# Patient Record
Sex: Female | Born: 1980 | Race: White | Hispanic: Yes | Marital: Single | Smoking: Never smoker
Health system: Southern US, Community
[De-identification: ages and names within clinical notes are randomized; demographics above are authoritative.]

## PROBLEM LIST (undated history)

## (undated) DIAGNOSIS — Z603 Acculturation difficulty: Secondary | ICD-10-CM

## (undated) DIAGNOSIS — Z789 Other specified health status: Secondary | ICD-10-CM

## (undated) HISTORY — PX: NO PAST SURGERIES: SHX2092

---

## 2005-07-13 ENCOUNTER — Inpatient Hospital Stay (HOSPITAL_COMMUNITY): Admission: AD | Admit: 2005-07-13 | Discharge: 2005-07-15 | Payer: Self-pay | Admitting: Obstetrics

## 2008-09-10 ENCOUNTER — Inpatient Hospital Stay (HOSPITAL_COMMUNITY): Admission: AD | Admit: 2008-09-10 | Discharge: 2008-09-12 | Payer: Self-pay | Admitting: Obstetrics

## 2011-08-30 LAB — CBC
HCT: 35.8 — ABNORMAL LOW
Hemoglobin: 12
Hemoglobin: 14.7
RBC: 3.86 — ABNORMAL LOW
RBC: 4.69
WBC: 12.7 — ABNORMAL HIGH
WBC: 12.9 — ABNORMAL HIGH

## 2011-08-30 LAB — RPR: RPR Ser Ql: NONREACTIVE

## 2011-10-13 ENCOUNTER — Other Ambulatory Visit (HOSPITAL_COMMUNITY): Payer: Self-pay | Admitting: Physician Assistant

## 2011-10-13 DIAGNOSIS — Z3689 Encounter for other specified antenatal screening: Secondary | ICD-10-CM

## 2011-10-13 LAB — RUBELLA ANTIBODY, IGM: Rubella: IMMUNE

## 2011-10-13 LAB — HIV ANTIBODY (ROUTINE TESTING W REFLEX): HIV: NONREACTIVE

## 2011-10-13 LAB — ABO/RH: RH Type: POSITIVE

## 2011-10-13 LAB — HEPATITIS B SURFACE ANTIGEN: Hepatitis B Surface Ag: NEGATIVE

## 2011-10-13 LAB — OB RESULTS CONSOLE VARICELLA ZOSTER ANTIBODY, IGG: Varicella: IMMUNE

## 2011-10-18 ENCOUNTER — Ambulatory Visit (HOSPITAL_COMMUNITY)
Admission: RE | Admit: 2011-10-18 | Discharge: 2011-10-18 | Disposition: A | Discharge status: Home / Self Care | Payer: Medicaid Other | Source: Ambulatory Visit | Attending: Physician Assistant | Admitting: Physician Assistant

## 2011-10-18 DIAGNOSIS — O358XX0 Maternal care for other (suspected) fetal abnormality and damage, not applicable or unspecified: Secondary | ICD-10-CM | POA: Insufficient documentation

## 2011-10-18 DIAGNOSIS — Z1389 Encounter for screening for other disorder: Secondary | ICD-10-CM | POA: Insufficient documentation

## 2011-10-18 DIAGNOSIS — Z3689 Encounter for other specified antenatal screening: Secondary | ICD-10-CM

## 2011-10-18 DIAGNOSIS — Z363 Encounter for antenatal screening for malformations: Secondary | ICD-10-CM | POA: Insufficient documentation

## 2011-11-29 ENCOUNTER — Other Ambulatory Visit (HOSPITAL_COMMUNITY): Payer: Self-pay | Admitting: Family

## 2011-11-29 LAB — RPR: RPR: NONREACTIVE

## 2011-12-01 ENCOUNTER — Ambulatory Visit (HOSPITAL_COMMUNITY)
Admission: RE | Admit: 2011-12-01 | Discharge: 2011-12-01 | Disposition: A | Discharge status: Home / Self Care | Payer: Self-pay | Source: Ambulatory Visit | Attending: Family | Admitting: Family

## 2011-12-01 DIAGNOSIS — O99891 Other specified diseases and conditions complicating pregnancy: Secondary | ICD-10-CM | POA: Insufficient documentation

## 2011-12-01 DIAGNOSIS — R319 Hematuria, unspecified: Secondary | ICD-10-CM | POA: Insufficient documentation

## 2012-01-27 ENCOUNTER — Encounter (HOSPITAL_COMMUNITY): Payer: Self-pay | Admitting: Pharmacist

## 2012-01-27 ENCOUNTER — Encounter (HOSPITAL_COMMUNITY): Payer: Self-pay

## 2012-01-28 ENCOUNTER — Encounter (HOSPITAL_COMMUNITY): Payer: Self-pay

## 2012-01-28 ENCOUNTER — Encounter (HOSPITAL_COMMUNITY)
Admission: RE | Admit: 2012-01-28 | Discharge: 2012-01-28 | Disposition: A | Discharge status: Home / Self Care | Payer: Medicaid Other | Source: Ambulatory Visit | Attending: Family Medicine | Admitting: Family Medicine

## 2012-01-28 HISTORY — DX: Other specified health status: Z78.9

## 2012-01-28 HISTORY — DX: Acculturation difficulty: Z60.3

## 2012-01-28 LAB — CBC
HCT: 38.5 % (ref 36.0–46.0)
MCH: 30.5 pg (ref 26.0–34.0)
MCV: 88.9 fL (ref 78.0–100.0)
RDW: 12 % (ref 11.5–15.5)
WBC: 9.8 10*3/uL (ref 4.0–10.5)

## 2012-01-28 NOTE — Patient Instructions (Addendum)
   Your procedure is scheduled on: Tuesday March 5th  Enter through the Main Entrance of [facility] at:1PM Pick up the phone at the desk and dial [phone] and inform us of your arrival.  Please call this number if you have any problems the morning of surgery: [phone]  Remember: Do not eat food after midnight:Monday Do not drink clear liquids after: 10:30am Tuesday Take these medicines the morning of surgery with a SIP OF WATER:  Do not wear jewelry, make-up, or FINGER nail polish Do not wear lotions, powders, perfumes or deodorant. Do not shave 48 hours prior to surgery. Do not bring valuables to the hospital. Contacts, dentures or bridgework may not be worn into surgery.  Leave suitcase in the car. After Surgery it may be brought to your room. For patients being admitted to the hospital, checkout time is 11:00am the day of discharge.     Remember to use your hibiclens as instructed.Please shower with 1/2 bottle the evening before your surgery and the other 1/2 bottle the morning of surgery. Neck down avoiding private area.

## 2012-02-01 ENCOUNTER — Encounter (HOSPITAL_COMMUNITY): Payer: Self-pay | Admitting: *Deleted

## 2012-02-01 ENCOUNTER — Inpatient Hospital Stay (HOSPITAL_COMMUNITY)
Admission: RE | Admit: 2012-02-01 | Discharge: 2012-02-04 | DRG: 766 | Disposition: A | Discharge status: Home Health | Payer: Medicaid Other | Source: Ambulatory Visit | Attending: Family Medicine | Admitting: Family Medicine

## 2012-02-01 ENCOUNTER — Inpatient Hospital Stay (HOSPITAL_COMMUNITY): Payer: Medicaid Other

## 2012-02-01 ENCOUNTER — Encounter (HOSPITAL_COMMUNITY)
Admission: RE | Disposition: A | Discharge status: Home Health | Payer: Self-pay | Source: Ambulatory Visit | Attending: Family Medicine

## 2012-02-01 ENCOUNTER — Encounter (HOSPITAL_COMMUNITY): Payer: Self-pay

## 2012-02-01 DIAGNOSIS — O321XX0 Maternal care for breech presentation, not applicable or unspecified: Secondary | ICD-10-CM

## 2012-02-01 DIAGNOSIS — Z01812 Encounter for preprocedural laboratory examination: Secondary | ICD-10-CM

## 2012-02-01 DIAGNOSIS — Z98891 History of uterine scar from previous surgery: Secondary | ICD-10-CM

## 2012-02-01 LAB — TYPE AND SCREEN: ABO/RH(D): O POS

## 2012-02-01 LAB — ABO/RH: ABO/RH(D): O POS

## 2012-02-01 SURGERY — Surgical Case
Anesthesia: Spinal

## 2012-02-01 MED ORDER — CEFAZOLIN SODIUM 1-5 GM-% IV SOLN
INTRAVENOUS | Status: AC
  Filled 2012-02-01: qty 50

## 2012-02-01 MED ORDER — LANOLIN HYDROUS EX OINT: 1.0000 "application " | TOPICAL_OINTMENT | CUTANEOUS | Status: DC | PRN

## 2012-02-01 MED ORDER — KETOROLAC TROMETHAMINE 30 MG/ML IJ SOLN
30.0000 mg | Freq: Four times a day (QID) | INTRAMUSCULAR | Status: AC | PRN
  Administered 2012-02-01: 30 mg via INTRAMUSCULAR

## 2012-02-01 MED ORDER — PHENYLEPHRINE 40 MCG/ML (10ML) SYRINGE FOR IV PUSH (FOR BLOOD PRESSURE SUPPORT)
PREFILLED_SYRINGE | INTRAVENOUS | Status: AC
  Filled 2012-02-01: qty 15

## 2012-02-01 MED ORDER — NALBUPHINE HCL 10 MG/ML IJ SOLN
5.0000 mg | INTRAMUSCULAR | Status: DC | PRN
  Filled 2012-02-01: qty 1

## 2012-02-01 MED ORDER — LACTATED RINGERS IV SOLN
INTRAVENOUS | Status: DC
  Administered 2012-02-01: 13:00:00 via INTRAVENOUS

## 2012-02-01 MED ORDER — FENTANYL CITRATE 0.05 MG/ML IJ SOLN: 25.0000 ug | INTRAMUSCULAR | Status: DC | PRN

## 2012-02-01 MED ORDER — METOCLOPRAMIDE HCL 5 MG/ML IJ SOLN: 10.0000 mg | Freq: Three times a day (TID) | INTRAMUSCULAR | Status: DC | PRN

## 2012-02-01 MED ORDER — ONDANSETRON HCL 4 MG/2ML IJ SOLN
INTRAMUSCULAR | Status: AC
  Filled 2012-02-01: qty 2

## 2012-02-01 MED ORDER — SIMETHICONE 80 MG PO CHEW: 80.0000 mg | CHEWABLE_TABLET | ORAL | Status: DC | PRN

## 2012-02-01 MED ORDER — SCOPOLAMINE 1 MG/3DAYS TD PT72
1.0000 | MEDICATED_PATCH | Freq: Once | TRANSDERMAL | Status: DC
  Administered 2012-02-01: 1.5 mg via TRANSDERMAL

## 2012-02-01 MED ORDER — OXYTOCIN 10 UNIT/ML IJ SOLN
INTRAMUSCULAR | Status: AC
  Filled 2012-02-01: qty 4

## 2012-02-01 MED ORDER — MORPHINE SULFATE 0.5 MG/ML IJ SOLN
INTRAMUSCULAR | Status: AC
  Filled 2012-02-01: qty 10

## 2012-02-01 MED ORDER — ONDANSETRON HCL 4 MG/2ML IJ SOLN: 4.0000 mg | Freq: Three times a day (TID) | INTRAMUSCULAR | Status: DC | PRN

## 2012-02-01 MED ORDER — IBUPROFEN 600 MG PO TABS
600.0000 mg | ORAL_TABLET | Freq: Four times a day (QID) | ORAL | Status: DC
  Administered 2012-02-01 – 2012-02-04 (×10): 600 mg via ORAL
  Filled 2012-02-01 (×2): qty 1

## 2012-02-01 MED ORDER — IBUPROFEN 600 MG PO TABS
600.0000 mg | ORAL_TABLET | Freq: Four times a day (QID) | ORAL | Status: DC | PRN
  Filled 2012-02-01 (×9): qty 1

## 2012-02-01 MED ORDER — SCOPOLAMINE 1 MG/3DAYS TD PT72
MEDICATED_PATCH | TRANSDERMAL | Status: AC
  Administered 2012-02-01: 1.5 mg via TRANSDERMAL
  Filled 2012-02-01: qty 1

## 2012-02-01 MED ORDER — SENNOSIDES-DOCUSATE SODIUM 8.6-50 MG PO TABS
2.0000 | ORAL_TABLET | Freq: Every day | ORAL | Status: DC
  Administered 2012-02-01 – 2012-02-03 (×3): 2 via ORAL

## 2012-02-01 MED ORDER — SODIUM CHLORIDE 0.9 % IJ SOLN: 3.0000 mL | INTRAMUSCULAR | Status: DC | PRN

## 2012-02-01 MED ORDER — MENTHOL 3 MG MT LOZG: 1.0000 | LOZENGE | OROMUCOSAL | Status: DC | PRN

## 2012-02-01 MED ORDER — FENTANYL CITRATE 0.05 MG/ML IJ SOLN
INTRAMUSCULAR | Status: AC
  Filled 2012-02-01: qty 2

## 2012-02-01 MED ORDER — LACTATED RINGERS IV SOLN
INTRAVENOUS | Status: DC
  Administered 2012-02-01 (×3): via INTRAVENOUS

## 2012-02-01 MED ORDER — PHENYLEPHRINE 40 MCG/ML (10ML) SYRINGE FOR IV PUSH (FOR BLOOD PRESSURE SUPPORT)
PREFILLED_SYRINGE | INTRAVENOUS | Status: AC
  Filled 2012-02-01: qty 10

## 2012-02-01 MED ORDER — BUPIVACAINE IN DEXTROSE 0.75-8.25 % IT SOLN
INTRATHECAL | Status: DC | PRN
  Administered 2012-02-01: 1.2 mL via INTRATHECAL

## 2012-02-01 MED ORDER — DIPHENHYDRAMINE HCL 50 MG/ML IJ SOLN: 25.0000 mg | INTRAMUSCULAR | Status: DC | PRN

## 2012-02-01 MED ORDER — KETOROLAC TROMETHAMINE 30 MG/ML IJ SOLN
INTRAMUSCULAR | Status: AC
  Filled 2012-02-01: qty 1

## 2012-02-01 MED ORDER — SCOPOLAMINE 1 MG/3DAYS TD PT72
1.0000 | MEDICATED_PATCH | Freq: Once | TRANSDERMAL | Status: DC
  Filled 2012-02-01: qty 1

## 2012-02-01 MED ORDER — KETOROLAC TROMETHAMINE 30 MG/ML IJ SOLN: 30.0000 mg | Freq: Four times a day (QID) | INTRAMUSCULAR | Status: AC | PRN

## 2012-02-01 MED ORDER — ONDANSETRON HCL 4 MG/2ML IJ SOLN: 4.0000 mg | INTRAMUSCULAR | Status: DC | PRN

## 2012-02-01 MED ORDER — CEFAZOLIN SODIUM 1-5 GM-% IV SOLN
1.0000 g | INTRAVENOUS | Status: AC
  Administered 2012-02-01: 1 g via INTRAVENOUS

## 2012-02-01 MED ORDER — LACTATED RINGERS IV SOLN
INTRAVENOUS | Status: DC
  Administered 2012-02-02: 01:00:00 via INTRAVENOUS

## 2012-02-01 MED ORDER — SIMETHICONE 80 MG PO CHEW
80.0000 mg | CHEWABLE_TABLET | Freq: Three times a day (TID) | ORAL | Status: DC
  Administered 2012-02-01 – 2012-02-04 (×10): 80 mg via ORAL

## 2012-02-01 MED ORDER — PHENYLEPHRINE HCL 10 MG/ML IJ SOLN
INTRAMUSCULAR | Status: DC | PRN
  Administered 2012-02-01: 40 ug via INTRAVENOUS
  Administered 2012-02-01: 120 ug via INTRAVENOUS
  Administered 2012-02-01: 40 ug via INTRAVENOUS
  Administered 2012-02-01: 80 ug via INTRAVENOUS

## 2012-02-01 MED ORDER — MEPERIDINE HCL 25 MG/ML IJ SOLN: 6.2500 mg | INTRAMUSCULAR | Status: DC | PRN

## 2012-02-01 MED ORDER — ONDANSETRON HCL 4 MG/2ML IJ SOLN
INTRAMUSCULAR | Status: DC | PRN
  Administered 2012-02-01: 4 mg via INTRAVENOUS

## 2012-02-01 MED ORDER — ACETAMINOPHEN 325 MG PO TABS: 325.0000 mg | ORAL_TABLET | ORAL | Status: DC | PRN

## 2012-02-01 MED ORDER — NALOXONE HCL 0.4 MG/ML IJ SOLN: 0.4000 mg | INTRAMUSCULAR | Status: DC | PRN

## 2012-02-01 MED ORDER — EPHEDRINE 5 MG/ML INJ
INTRAVENOUS | Status: AC
  Filled 2012-02-01: qty 10

## 2012-02-01 MED ORDER — SODIUM CHLORIDE 0.9 % IV SOLN
1.0000 ug/kg/h | INTRAVENOUS | Status: DC | PRN
  Filled 2012-02-01: qty 2.5

## 2012-02-01 MED ORDER — ACETAMINOPHEN 10 MG/ML IV SOLN
1000.0000 mg | Freq: Four times a day (QID) | INTRAVENOUS | Status: AC | PRN
  Filled 2012-02-01: qty 100

## 2012-02-01 MED ORDER — DIBUCAINE 1 % RE OINT: 1.0000 "application " | TOPICAL_OINTMENT | RECTAL | Status: DC | PRN

## 2012-02-01 MED ORDER — DIPHENHYDRAMINE HCL 50 MG/ML IJ SOLN: 12.5000 mg | INTRAMUSCULAR | Status: DC | PRN

## 2012-02-01 MED ORDER — ONDANSETRON HCL 4 MG PO TABS: 4.0000 mg | ORAL_TABLET | ORAL | Status: DC | PRN

## 2012-02-01 MED ORDER — TETANUS-DIPHTH-ACELL PERTUSSIS 5-2.5-18.5 LF-MCG/0.5 IM SUSP
0.5000 mL | Freq: Once | INTRAMUSCULAR | Status: AC
  Administered 2012-02-02: 0.5 mL via INTRAMUSCULAR
  Filled 2012-02-01: qty 0.5

## 2012-02-01 MED ORDER — CEFAZOLIN SODIUM 1-5 GM-% IV SOLN
INTRAVENOUS | Status: DC | PRN
  Administered 2012-02-01: 1 g via INTRAVENOUS

## 2012-02-01 MED ORDER — EPHEDRINE SULFATE 50 MG/ML IJ SOLN
INTRAMUSCULAR | Status: DC | PRN
  Administered 2012-02-01: 25 mg via INTRAVENOUS
  Administered 2012-02-01 (×2): 10 mg via INTRAVENOUS

## 2012-02-01 MED ORDER — OXYCODONE-ACETAMINOPHEN 5-325 MG PO TABS
1.0000 | ORAL_TABLET | ORAL | Status: DC | PRN
  Administered 2012-02-03 (×2): 1 via ORAL
  Filled 2012-02-01 (×2): qty 1

## 2012-02-01 MED ORDER — MORPHINE SULFATE (PF) 0.5 MG/ML IJ SOLN
INTRAMUSCULAR | Status: DC | PRN
  Administered 2012-02-01: .1 mg via INTRATHECAL

## 2012-02-01 MED ORDER — WITCH HAZEL-GLYCERIN EX PADS: 1.0000 "application " | MEDICATED_PAD | CUTANEOUS | Status: DC | PRN

## 2012-02-01 MED ORDER — DIPHENHYDRAMINE HCL 25 MG PO CAPS: 25.0000 mg | ORAL_CAPSULE | ORAL | Status: DC | PRN

## 2012-02-01 MED ORDER — ZOLPIDEM TARTRATE 5 MG PO TABS: 5.0000 mg | ORAL_TABLET | Freq: Every evening | ORAL | Status: DC | PRN

## 2012-02-01 MED ORDER — OXYTOCIN 20 UNITS IN LACTATED RINGERS INFUSION - SIMPLE
INTRAVENOUS | Status: AC
  Filled 2012-02-01: qty 1000

## 2012-02-01 MED ORDER — PRENATAL MULTIVITAMIN CH
1.0000 | ORAL_TABLET | Freq: Every day | ORAL | Status: DC
  Administered 2012-02-02 – 2012-02-04 (×3): 1 via ORAL
  Filled 2012-02-01 (×3): qty 1

## 2012-02-01 MED ORDER — OXYTOCIN 20 UNITS IN LACTATED RINGERS INFUSION - SIMPLE
125.0000 mL/h | INTRAVENOUS | Status: AC
  Administered 2012-02-01: 125 mL/h via INTRAVENOUS

## 2012-02-01 MED ORDER — DIPHENHYDRAMINE HCL 50 MG/ML IJ SOLN
INTRAMUSCULAR | Status: AC
  Filled 2012-02-01: qty 1

## 2012-02-01 MED ORDER — FENTANYL CITRATE 0.05 MG/ML IJ SOLN
INTRAMUSCULAR | Status: DC | PRN
  Administered 2012-02-01: 25 ug via INTRATHECAL

## 2012-02-01 MED ORDER — DIPHENHYDRAMINE HCL 25 MG PO CAPS: 25.0000 mg | ORAL_CAPSULE | Freq: Four times a day (QID) | ORAL | Status: DC | PRN

## 2012-02-01 MED ORDER — OXYTOCIN 10 UNIT/ML IJ SOLN
INTRAMUSCULAR | Status: DC | PRN
  Administered 2012-02-01: 20 [IU] via INTRAMUSCULAR

## 2012-02-01 SURGICAL SUPPLY — 35 items
APL SKNCLS STERI-STRIP NONHPOA (GAUZE/BANDAGES/DRESSINGS) ×1
BARRIER ADHS 3X4 INTERCEED (GAUZE/BANDAGES/DRESSINGS) IMPLANT
BENZOIN TINCTURE PRP APPL 2/3 (GAUZE/BANDAGES/DRESSINGS) ×1 IMPLANT
BRR ADH 4X3 ABS CNTRL BYND (GAUZE/BANDAGES/DRESSINGS)
CHLORAPREP W/TINT 26ML (MISCELLANEOUS) ×2 IMPLANT
CLOTH BEACON ORANGE TIMEOUT ST (SAFETY) ×2 IMPLANT
DRESSING TELFA 8X3 (GAUZE/BANDAGES/DRESSINGS) ×1 IMPLANT
DRSG PAD ABDOMINAL 8X10 ST (GAUZE/BANDAGES/DRESSINGS) ×1 IMPLANT
ELECT REM PT RETURN 9FT ADLT (ELECTROSURGICAL) ×2
ELECTRODE REM PT RTRN 9FT ADLT (ELECTROSURGICAL) ×1 IMPLANT
EXTRACTOR VACUUM KIWI (MISCELLANEOUS) IMPLANT
GLOVE BIO SURGEON STRL SZ 6.5 (GLOVE) ×2 IMPLANT
GLOVE BIOGEL PI IND STRL 7.0 (GLOVE) ×2 IMPLANT
GLOVE BIOGEL PI INDICATOR 7.0 (GLOVE) ×2
GOWN PREVENTION PLUS LG XLONG (DISPOSABLE) ×6 IMPLANT
KIT ABG SYR 3ML LUER SLIP (SYRINGE) IMPLANT
NDL HYPO 25X5/8 SAFETYGLIDE (NEEDLE) IMPLANT
NEEDLE HYPO 25X5/8 SAFETYGLIDE (NEEDLE) IMPLANT
NS IRRIG 1000ML POUR BTL (IV SOLUTION) ×2 IMPLANT
PACK C SECTION WH (CUSTOM PROCEDURE TRAY) ×2 IMPLANT
RETRACTOR WND ALEXIS 25 LRG (MISCELLANEOUS) IMPLANT
RTRCTR WOUND ALEXIS 25CM LRG (MISCELLANEOUS) ×2
SLEEVE SCD COMPRESS KNEE LRG (MISCELLANEOUS) IMPLANT
SLEEVE SCD COMPRESS KNEE MED (MISCELLANEOUS) IMPLANT
SPONGE GAUZE 4X4 12PLY (GAUZE/BANDAGES/DRESSINGS) ×1 IMPLANT
STRIP CLOSURE SKIN 1/2X4 (GAUZE/BANDAGES/DRESSINGS) ×1 IMPLANT
SUT VIC AB 0 CT1 36 (SUTURE) ×12 IMPLANT
SUT VIC AB 2-0 CT1 27 (SUTURE) ×2
SUT VIC AB 2-0 CT1 TAPERPNT 27 (SUTURE) ×1 IMPLANT
SUT VIC AB 4-0 KS 27 (SUTURE) ×1 IMPLANT
SUT VIC AB 4-0 PS2 27 (SUTURE) ×2 IMPLANT
TAPE CLOTH SURG 4X10 WHT LF (GAUZE/BANDAGES/DRESSINGS) ×1 IMPLANT
TOWEL OR 17X24 6PK STRL BLUE (TOWEL DISPOSABLE) ×4 IMPLANT
TRAY FOLEY CATH 14FR (SET/KITS/TRAYS/PACK) IMPLANT
WATER STERILE IRR 1000ML POUR (IV SOLUTION) ×2 IMPLANT

## 2012-02-01 NOTE — Anesthesia Postprocedure Evaluation (Signed)
Anesthesia Post Note  Patient: Dawn [name]  Procedure(s) Performed: Procedure(s) (LRB): CESAREAN SECTION (N/A)  Anesthesia type: Spinal  Patient location: PACU  Post pain: Pain level controlled  Post assessment: Post-op Vital signs reviewed  Last Vitals:  Filed Vitals:   02/01/12 1539  BP: 118/66  Pulse: 111  Temp: 36.6 C  Resp: 24    Post vital signs: Reviewed  Level of consciousness: awake  Complications: No apparent anesthesia complications

## 2012-02-01 NOTE — Transfer of Care (Signed)
Immediate Anesthesia Transfer of Care Note  Patient: Dawn [name]  Procedure(s) Performed: Procedure(s) (LRB): CESAREAN SECTION (N/A)  Patient Location: PACU  Anesthesia Type: Spinal  Level of Consciousness: awake, alert  and oriented  Airway & Oxygen Therapy: Patient Spontanous Breathing  Post-op Assessment: Report given to PACU RN and Post -op Vital signs reviewed and stable  Post vital signs: Reviewed and stable  Complications: No apparent anesthesia complications

## 2012-02-01 NOTE — Op Note (Addendum)
Dawn [name] PROCEDURE DATE: 02/01/2012  PREOPERATIVE DIAGNOSIS: Intrauterine pregnancy at  [redacted]w[redacted]d weeks gestation; breech presentation  POSTOPERATIVE DIAGNOSIS: The same  PROCEDURE: Primary Low Transverse Cesarean Section  SURGEON:  Dr. Scheryl Darter  ASSISTANT: Dr Candelaria Celeste  INDICATIONS: Dawn [name] is a 31 y.o. [id number] at [redacted]w[redacted]d scheduled for cesarean section secondary to breech presentation.  The risks of cesarean section discussed with the patient included but were not limited to: bleeding which may require transfusion or reoperation; infection which may require antibiotics; injury to bowel, bladder, ureters or other surrounding organs; injury to the fetus; need for additional procedures including hysterectomy in the event of a life-threatening hemorrhage; placental abnormalities wth subsequent pregnancies, incisional problems, thromboembolic phenomenon and other postoperative/anesthesia complications. The patient concurred with the proposed plan, giving informed written consent for the procedure.    FINDINGS:  Viable female infant in cephalic presentation.  Apgars 8 and 9, weight, 7 pounds and 6 ounces.  Clear amniotic fluid.  Intact placenta, three vessel cord.  Normal uterus, fallopian tubes and ovaries bilaterally.  ANESTHESIA:    Spinal INTRAVENOUS FLUIDS:2000 ml ESTIMATED BLOOD LOSS: 500 ml URINE OUTPUT:  150 ml SPECIMENS: Placenta sent to L&D COMPLICATIONS: None immediate  PROCEDURE IN DETAIL:  The patient received intravenous antibiotics and had sequential compression devices applied to her lower extremities while in the preoperative area.  She was then taken to the operating room where spinal anesthesia was administered and was found to be adequate. She was then placed in a dorsal supine position with a leftward tilt, and prepped and draped in a sterile manner.  A foley catheter was placed into her bladder and attached to constant gravity, which drained clear fluid  throughout.  After an adequate timeout was performed, a Pfannenstiel skin incision was made with scalpel and carried through to the underlying layer of fascia. The fascia was incised in the midline and this incision was extended bilaterally using the Mayo scissors. Kocher clamps were applied to the superior aspect of the fascial incision and the underlying rectus muscles were dissected off bluntly. A similar process was carried out on the inferior aspect of the facial incision. The rectus muscles were separated in the midline bluntly and the peritoneum was entered bluntly.  An Alexis retractor was placed.   Attention was turned to the lower uterine segment where a transverse hysterotomy was made with a scalpel and extended bilaterally bluntly.  The infant was successfully delivered, and cord was clamped and cut and infant was handed over to awaiting neonatology team. Uterine massage was then administered and the placenta delivered intact with three-vessel cord. The uterus was then cleared of clot and debris.  The hysterotomy was closed with 0 Vicryl in a running locked fashion, and an imbricating layer was also placed with a 0 Vicryl. Overall, excellent hemostasis was noted. The abdomen and the pelvis were cleared of all clot and debris. Hemostasis was confirmed on all surfaces.  The peritoneum was reapproximated using 2-0 vicryl running stitches. The fascia was then closed using 0 Vicryl in a running fashion.  The skin was closed with 4-0 Vicryl. The patient tolerated the procedure well. Sponge, lap, instrument and needle counts were correct x 2. She was taken to the recovery room in stable condition.    Candelaria Celeste JEHIEL DO 02/01/2012 3:30 PM

## 2012-02-01 NOTE — Anesthesia Preprocedure Evaluation (Signed)

## 2012-02-01 NOTE — Consult Note (Signed)
Called to attend primary C/S  At term secondary to breech presentation.  No other risk factors reported. Translator present and physician introduced.    AT birth infant in breech with a single loose nuchal cord per the OB; good tone observed but cry was not until cord clamped and infant en route to radiant warmer.   Normal transiiton with slow recovery of central color. No dysmorphic features.  Given Apgar scores of 8 and 9 at one and five minutes respectively.  Voided in OR.  Infant shown to parents with translator present. Care transferred to RN attendant from [facility] and to assigned pediatrician  Dawn [name]. Alphonsa Gin MD [facility] .

## 2012-02-01 NOTE — Anesthesia Procedure Notes (Signed)
Spinal  Patient location during procedure: OR Start time: 02/01/2012 2:37 PM Staffing Anesthesiologist: Brayton Caves R Performed by: anesthesiologist  Preanesthetic Checklist Completed: patient identified, site marked, surgical consent, pre-op evaluation, timeout performed, IV checked, risks and benefits discussed and monitors and equipment checked Spinal Block Patient position: sitting Prep: DuraPrep Patient monitoring: heart rate, cardiac monitor, continuous pulse ox and blood pressure Approach: midline Location: L3-4 Injection technique: single-shot Needle Needle type: Sprotte  Needle gauge: 24 G Needle length: 9 cm Assessment Sensory level: T4 Additional Notes Patient identified.  Risk benefits discussed including failed block, incomplete pain control, headache, nerve damage, paralysis, blood pressure changes, nausea, vomiting, reactions to medication both toxic or allergic, and postpartum back pain.  Patient expressed understanding and wished to proceed.  All questions were answered.  Sterile technique used throughout procedure.  CSF was clear.  No parasthesia or other complications.  Please see nursing notes for vital signs.

## 2012-02-01 NOTE — H&P (Signed)
  Dawn [name] is a 31 y.o. female G3P2002 with IUP at [redacted]w[redacted]d presenting for primary cesarean section for breech presentation. Pt states she has been having no vaginal bleeding, no contractions, no vaginal discharge, with good fetal activity.    Prenatal Course Source of Care: Health department with onset of care at  27 weeks Pregnancy complications or risks:There is no problem list on file for this patient.  She plans to plans to breastfeed  Prenatal labs and studies: ABO, Rh: O/Positive/-- (11/14 0000) Antibody: Negative (11/14 0000) Rubella:   RPR: NON REACTIVE (03/01 0834)  HBsAg: Negative (11/14 0000)  HIV: Non-reactive (11/14 0000)  GBS:    1 hr Glucola 117 Genetic screeningnot preformed Anatomy US normal  Past Medical History:  Past Medical History  Diagnosis Date  . No pertinent past medical history   . Language [facility] Interpreter Dawn [name] assisted with pat history    Past Surgical History:  Past Surgical History  Procedure Date  . No past surgeries   . Vaginal delivery x2    Obstetrical History:  OB History    Grav Para Term Preterm Abortions TAB SAB Ect Mult Living   3 2 2       2       Gynecological History:  OB History    Grav Para Term Preterm Abortions TAB SAB Ect Mult Living   3 2 2       2       Social History:  History   Social History  . Marital Status: Single    Spouse Name: N/A    Number of Children: N/A  . Years of Education: N/A   Social History Main Topics  . Smoking status: Never Smoker   . Smokeless tobacco: Not on file  . Alcohol Use: No  . Drug Use: No  . Sexually Active:    Other Topics Concern  . Not on file   Social History Narrative  . No narrative on file    Family History: No family history on file.  Medications:  Prenatal vitamins,  No current facility-administered medications for this encounter.    Allergies: No Known Allergies  Review of Systems: - Negative.  Physical Exam: Last  menstrual period 05/04/2011. GENERAL: Well-developed, well-nourished female in no acute distress.  LUNGS: Clear to auscultation bilaterally.  HEART: Regular rate and rhythm. ABDOMEN: Soft, nontender, nondistended, gravid. EFW 7.5 lbs EXTREMITIES: Nontender, no edema, 2+ distal pulses.  Assessment : Dawn [name] is a 30 y.o. G3P2002 at [redacted]w[redacted]d being admitted for primary cesarean section for breech.  Plan: The risks of cesarean section discussed with the patient included but were not limited to: bleeding which may require transfusion or reoperation; infection which may require antibiotics; injury to bowel, bladder, ureters or other surrounding organs; injury to the fetus; need for additional procedures including hysterectomy in the event of a life-threatening hemorrhage; placental abnormalities wth subsequent pregnancies, incisional problems, thromboembolic phenomenon and other postoperative/anesthesia complications. The patient concurred with the proposed plan, giving informed written consent for the procedure.   Preoperative prophylactic Ancef ordered on call to the OR.  To OR when ready.   Dawn [name] 02/01/2012, 1:14 PM

## 2012-02-02 ENCOUNTER — Encounter (HOSPITAL_COMMUNITY): Payer: Self-pay | Admitting: Obstetrics & Gynecology

## 2012-02-02 LAB — CBC
MCH: 31.1 pg (ref 26.0–34.0)
MCV: 88.6 fL (ref 78.0–100.0)
Platelets: 139 10*3/uL — ABNORMAL LOW (ref 150–400)
RBC: 3.25 MIL/uL — ABNORMAL LOW (ref 3.87–5.11)
RDW: 12 % (ref 11.5–15.5)
WBC: 10.6 10*3/uL — ABNORMAL HIGH (ref 4.0–10.5)

## 2012-02-02 MED ORDER — DIPHENHYDRAMINE HCL 50 MG/ML IJ SOLN
INTRAMUSCULAR | Status: DC | PRN
  Administered 2012-02-01: 25 mg via INTRAVENOUS

## 2012-02-02 NOTE — Progress Notes (Signed)
Subjective: Postpartum Day #1 Primary LT: Cesarean Delivery for breech Patient reports tolerating PO, + flatus and no problems voiding.  Minimal incisional pain.  Objective: Vital signs in last 24 hours: Temp:  [97.3 F (36.3 C)-98.3 F (36.8 C)] 98.3 F (36.8 C) (03/06 0656) Pulse Rate:  [73-115] 87  (03/06 0656) Resp:  [16-24] 18  (03/06 0656) BP: (96-118)/(61-92) 104/61 mmHg (03/06 0656) SpO2:  [97 %-100 %] 100 % (03/06 0656) Weight:  [57.607 kg (127 lb)] 57.607 kg (127 lb) (03/05 1545)  Physical Exam:  General: alert, cooperative, appears stated age and no distress Lochia: appropriate Uterine Fundus: firm NT Incision: Dressing clean, dry, intact DVT Evaluation: No evidence of DVT seen on physical exam.   Basename 02/02/12 0615  HGB 10.1*  HCT 28.8*    Assessment/Plan: Status post Cesarean section. Doing well postoperatively.  Continue current care. D/C IV and remove dressing this am.  Amire Leazer 02/02/2012, 7:34 AM

## 2012-02-02 NOTE — Addendum Note (Signed)
Addendum  created 02/02/12 1014 by Sandee Bernath Marie Kaysan Peixoto, RN   Modules edited:Anesthesia Medication Administration    

## 2012-02-02 NOTE — Addendum Note (Signed)
Addendum  created 02/02/12 1014 by Salome Arnt, RN   Modules edited:Anesthesia Medication Administration

## 2012-02-02 NOTE — Progress Notes (Signed)
UR chart review completed.  

## 2012-02-02 NOTE — Progress Notes (Signed)
[facility] Department of Clinical Social Work Documentation of Interpretation   I assisted ___________________ with interpretation of _stopped by to check on patient_____________________ for this patient. 

## 2012-02-03 MED ORDER — HYDROCORTISONE 1 % EX CREA
TOPICAL_CREAM | Freq: Four times a day (QID) | CUTANEOUS | Status: DC
  Administered 2012-02-03 – 2012-02-04 (×4): via TOPICAL
  Filled 2012-02-03: qty 28

## 2012-02-03 NOTE — Progress Notes (Signed)
Post Partum Day 2 - s/p csection for breech Subjective: no complaints, up ad lib, voiding, tolerating PO and + flatus; information obtained via interpreter [phone]; pt also reports burning sensation on skin  Objective: Blood pressure 97/69, pulse 66, temperature 97.8 F (36.6 C), temperature source Oral, resp. rate 20, weight 57.607 kg (127 lb), last menstrual period 05/04/2011, SpO2 97.00%, unknown if currently breastfeeding.  Physical Exam:  General: alert, cooperative and appears stated age 19: appropriate Uterine Fundus: firm Abdomen - skin slightly red; appears allergic reaction where tape was placed Incision: healing well, no significant drainage, no dehiscence, no significant erythema DVT Evaluation: No evidence of DVT seen on physical exam. Negative Homan's sign.   Basename 02/02/12 0615  HGB 10.1*  HCT 28.8*    Assessment/Plan: Plan for discharge tomorrow Apply hydrocortisone cream on irritation  LOS: 2 days   [facility] 02/03/2012, 10:46 AM

## 2012-02-04 DIAGNOSIS — Z98891 History of uterine scar from previous surgery: Secondary | ICD-10-CM

## 2012-02-04 MED ORDER — POLYETHYLENE GLYCOL 3350 17 GM/SCOOP PO POWD: 17.0000 g | Freq: Every day | ORAL | Status: AC

## 2012-02-04 MED ORDER — IBUPROFEN 600 MG PO TABS: 600.0000 mg | ORAL_TABLET | Freq: Four times a day (QID) | ORAL | Status: AC | PRN

## 2012-02-04 MED ORDER — OXYCODONE-ACETAMINOPHEN 5-325 MG PO TABS: 1.0000 | ORAL_TABLET | ORAL | Status: AC | PRN

## 2012-02-04 NOTE — Discharge Instructions (Signed)
Parto por Orvan Seen posteriores  (Cesarean Delivery, Care After) Siga estas instrucciones durante las prximas semanas. Estas indicaciones le proporcionan informacin general acerca de cmo deber cuidarse despus del procedimiento. El mdico tambin podr darle instrucciones especficas. El tratamiento ha sido planificado segn las prcticas mdicas actuales, pero en algunos casos pueden ocurrir problemas. Comunquese con el mdico si tiene algn problema o tiene preguntas despus del procedimiento.  INSTRUCCIONES PARA EL CUIDADO EN EL HOGAR  La curacin puede demorar algn tiempo. Puede sentir molestias, sensibilidad, hinchazn y hematomas en el sitio de la operacin, durante algunas semanas. Esto es normal y Scientist, clinical (histocompatibility and immunogenetics) a medida que pase el [place].  Actividad  Al volver a su casa, durante las 2 primeras semanas descanse siempre que pueda.   Siempre que le sea posible, solicite ayuda para Education officer, environmental las actividades domsticas y para el cuidado del beb, durante 2  3 semanas.   Limite las tareas domsticas y la actividad social. Aumente gradualmente su actividad a medida que recupera la fuerza.   No suba escaleras ms de 2 o 3 veces por da.   No levante nada que sea ms pesado que su beb.   Siga las instrucciones de su mdico con respecto a Solicitor.   Consulte con su mdico si puede practicar ejercicios.  Nutricin  Puede volver a su dieta habitual. Consuma una dieta normal y bien balanceada.   Debe ingerir gran cantidad de lquido para mantener la orina de tono claro o color amarillo plido.   Siga tomando los suplementos para el perodo prenatal o el complejo multivitamnico.   No beba alcohol hasta que el mdico la autorice.  Evacuacin Debe retornar a su funcin intestinal habitual. Si est constipada, consulte a su mdico para tomar un laxante suave que la ayudar a ir al bao. Los lquidos y los alimentos que contengan salvado la ayudarn para el problema de la  constipacin. Gradualmente agregue frutas, verduras y salvado a su dieta. Higiene  Puede darse una ducha, lavarse el cabello y usar la baera, excepto que su mdico le indique otra cosa.   Contine con los cuidados perineales hasta que la secrecin vaginal se detenga.   No se haga duchas vaginales ni use tampones hasta que el mdico la autorice.  Fiebre Si se siente [place] o tiene escalofros, Automotive engineer. La fiebre puede indicar que hay una infeccin. Las infecciones se tratan con medicamentos.  Control del Dolor  Tome slo medicamentos de venta libre o prescriptos, segn las indicaciones del mdico. No tome aspirina. Puede ocasionar hemorragias.   No conduzca mientras toma analgsicos.   Consulte a su mdico para volver a tomar o ajustar las dosis de sus medicamentos habituales.  Cuidados de la Incisin  Limpie la herida (incisin) suavemente con jabn y agua.   Si el mdico la autoriza, deje la herida sin vendaje, excepto que observe supuracin o irritacin.   Si tiene pequeas tiras [employer] que cruzan la incisin y no se caen dentro de los [address], retrelas suavemente.   Controle diariamente la incisin y observe si aumenta el enrojecimiento, supura, se hincha o se separa la piel.   Abrace una almohada cuando tosa o estornude. Esto ayuda a Engineer, materials.  Cuidados Vaginales La secrecin vaginal o la hemorragia pueden durar hasta 6 semanas. Si esta secrecin se torna de color rojo brillante, presenta un olor ftido, es demasiado abundante, contiene cogulos o si siente ardor al Geographical information systems officer o debe hacerlo con mucha frecuencia, llame al [employer]  asiste. Si la hemorragia disminuye y luego se hace ms intensa, es su organismo que le dice que debe disminuir la actividad y relajarse ms. Relaciones Sexuales  Consulte a su mdico antes de reanudar la actividad sexual. [place], luego de 4 a 6 semanas, si se siente bien y descansada, podr reanudar la actividad  sexual. Evite las posiciones que fuercen el sitio de la incisin.   Puede quedar embarazada antes de tener el perodo. Si reanuda las relaciones sexuales, debe utilizar algn mtodo anticonceptivo si no quiere quedar embarazada enseguida.  Prcticas Saludables  Cumpla con todas las visitas de control, segn le indique su mdico. Generalmente el mdico indicar que vuelva en 2  3 semanas.   Contine con los exmenes plvicos anuales.   Contine con el autoexamen de [employer] y los exmenes anuales que incluyan un [place].  Cuidado de las Mamas  Si no est Insurance risk surveyor, y sus Building control surveyor se tornan sensibles, se endurecen o la [place], puede usar un sostn que le ajuste firmemente y Contractor hielo.   Si est amamantando, use un buen sostn.   Comunquese con el profesional que la asiste si siente dolor en las [place], sntomas similares a una gripe, Teacher, English as a foreign language o endurecimiento y enrojecimiento de las mamas.  Depresin Posparto Despus del entusiasmo por la llegada del beb, generalmente puede suceder un perodo de abatimiento o depresin. Comente sus sensaciones con su pareja, familiares y amigos. Puede estar originado en la modificacin de los niveles de hormonas en el organismo. Si esto la preocupa, puede contactarse con el profesional que la asiste. MISCELNEAS  Limite el uso de bombachas de sostn o medias panty.   Si amamanta, puede ser que no tenga su perodo menstrual por algunos meses o ms. Esto es normal en una mujer que [place]. Si no Architectural technologist de las 6 [employer] posteriores a la interrupcin de la Market researcher, consulte a su mdico.   Si no est amamantando, debe esperar que comience a Clinical cytogeneticist de las 6 a 12 semanas posteriores al parto. Si no ha comenzado para la 11 semana, consulte a su mdico.  SOLICITE ATENCIN MDICA SI:  Presenta enrojecimiento, hinchazn o aumento del dolor en la herida.   Aparece pus en la herida.   Advierte un olor ftido que proviene de la  herida o del vendaje.   La zona de la inyeccin intravenosa se hincha, se inflama o duele.   La herida se abre (los bordes no estn unidos).   Se siente mareada o sufre un desmayo.   Siente dolor al Geographical information systems officer u [place] con [place].   Presenta diarrea.   Presenta nuseas o vmitos.   Brett Fairy secrecin vaginal anormal.   Aparece una erupcin cutnea.   Sufre algn tipo de reaccin anormal o alrgica debido a los medicamentos que toma.   El dolor no se [place] con los medicamentos o [place].   Su temperatura es de 101 F (38.3 C), o es 100.4 F (38 C) tomada 2 veces en un perodo de 4 horas.  SOLICITE ATENCIN MDICA DE INMEDIATO SI:  La temperatura se eleva por encima de 102 F (38.9 C).   Siente dolor abdominal.   Siente dolor en el pecho.   Le falta el aire.   Se desmaya.   Presenta dolor, enrojecimiento o hinchazn en las piernas.   Sufre una hemorragia intensa con o sin cogulos de Retail buyer.  Document Released: 11/15/2005 Document Revised: 11/04/2011 [facility] Patient Information 2012 [place], [place].Cesarean Delivery Care After Refer to this  sheet in the next few weeks. These instructions provide you with information on caring for yourself after your procedure. Your caregiver may also give you more specific instructions. Your treatment has been planned according to current medical practices, but problems sometimes occur. Call your caregiver if you have any problems or questions after your procedure. HOME CARE INSTRUCTIONS Healing will take time. You will have discomfort, tenderness, swelling, and bruising at the surgery site for a couple of weeks. This is normal and will get better as time goes on. Activity  Rest as much as possible the first 2 weeks.   When possible, have someone help you with your household activities and your baby for 2 to 3 weeks.   Limit your housework and social activity. Increase your activity gradually as your strength returns.   Do not  climb stairs more than 2 to 3 times a day.   Do not lift anything heavier than your baby.   Follow your caregiver's instructions about driving a car.   Exercise only as directed by your caregiver.  Nutrition  You may return to your usual diet. Eat a well-balanced diet.   Drink enough fluid to keep your urine clear or pale yellow.   Keep taking your prenatal or multivitamins.   Do not drink alcohol until your caregiver says it is okay.  Elimination You should return to your usual bowel function. If you develop constipation, ask your caregiver about taking a mild laxative that will help you go to the bathroom. Bran foods and fluids help with constipation. Gradually add fruit, vegetables, and bran to your diet.  Hygiene  You may shower, wash your hair, and take tub baths unless your caregiver tells you otherwise.   Continue perineal care until your vaginal bleeding and discharge stops.   Do not douche or use tampons until your caregiver says it is okay.  Fever If you feel feverish or have shaking chills, take your temperature. The fever may indicate infection. Infections can be treated with antibiotic medicine. Pain Control and Medicine  Only take over-the-counter or prescription medicine as directed by your caregiver. Do not take aspirin. It can cause bleeding.   Do not drive when taking pain medicine.   Talk to your caregiver about restarting or adjusting your normal medicines.  Incision Care  Clean your cut (incision) gently with soap and water, then pat dry.   If your caregiver says it is okay, leave the incision without a bandage (dressing) unless it is draining fluid or irritated.   If you have small adhesive strips across the incision and they do not fall off within 7 days, carefully peel them off.   Check the incision daily for increased redness, drainage, swelling, or separation of skin.   Hug a pillow when coughing or sneezing. This helps to relieve pain.  Vaginal  Care You may have a vaginal discharge or bleeding for up to 6 weeks. If the vaginal discharge becomes bright red, bad smelling, heavy in amount, has blood clots, or if you have burning or frequent urination, call your caregiver.If your bleeding slows down and then gets heavier, your body is telling you to slow down and relax more. Sexual Intercourse  Check with your caregiver before resuming sexual activity. Often, after 4 to 6 weeks, if you feel good and are well rested, sexual activity may be resumed. Avoid positions that strain the incision site.   You can become pregnant before you have a period. If you decide to have sexual  intercourse, use birth control if you do not want to become pregnant right away.  Health Practices  Keep all your postpartum appointments as recommended by your caregiver. Generally, your caregiver will want to see you in 2 to 3 weeks.   Continue with your yearly pelvic exams.   Continue monthly self-breast exams and yearly physical exams with a Pap test.  Breast Care  If you are not breastfeeding and your breasts become tender, hard, or leak milk, you may wear a tight-fitting bra and apply ice to your breasts.   If you are breastfeeding, wear a good support bra.   Call your caregiver if you have breast pain, flu-like symptoms, fever, or hardness and reddening of your breasts.  Postpartum Blues You may have a period of low spirits or "blues" after your baby is born. Discuss your feelings with your partner, family, and friends. This may be caused by the changing hormone levels in your body. You may want to contact your caregiver if this is worrisome. Miscellaneous  Limit wearing support panties or control-top hose.   If you breastfeed, you may not have a period for several months or longer. This is normal for the nursing mother. If you do not menstruate within 6 weeks after you stop breastfeeding, see your caregiver.   If you are not breastfeeding, you can expect  to menstruate within 6 to 10 weeks after birth. If you have not started by the 11th week, check with your caregiver.  SEEK MEDICAL CARE IF:   There is swelling, redness, or increasing pain in the wound area.   You have pus coming from the wound.   You notice a bad smell from the wound or surgical dressing.   You have pain, redness, and swelling from the intravenous (IV) site.   Your wound breaks open (the edges are not staying together).   You feel dizzy or feel like fainting.   You develop pain or bleeding when you urinate.   You develop diarrhea.   You develop nausea and vomiting.   You develop abnormal vaginal discharge.   You develop a rash.   You have any type of abnormal reaction or develop an allergy to your medicine.   Your pain is not relieved by your medicine or becomes worse.   Your temperature is 101 F (38.3 C), or is 100.4 F (38 C) taken 2 times in a 4 hour period.  SEEK IMMEDIATE MEDICAL CARE:  You develop a temperature of 102 F (38.9 C) or higher.   You develop abdominal pain.   You develop chest pain.   You develop shortness of breath.   You faint.   You develop pain, swelling, or redness of your leg.   You develop heavy vaginal bleeding with or without blood clots.  Document Released: 08/07/2002 Document Revised: 11/04/2011 Document Reviewed: 02/10/2011 [facility] Patient Information 2012 [place], [place].

## 2012-02-04 NOTE — Discharge Summary (Signed)
Obstetric Discharge Summary Reason for Admission: cesarean section Prenatal Procedures: none Intrapartum Procedures: cesarean: low cervical, transverse for breech, Primary C/S Postpartum Procedures: none Complications-Operative and Postpartum: none Hemoglobin  Date Value Range Status  02/02/2012 10.1* 12.0-15.0 (g/dL) Final     HCT  Date Value Range Status  02/02/2012 28.8* 36.0-46.0 (%) Final  Hospital Course: This is a 31 y.o. at [redacted]w[redacted]d who presented for a primary C/S for breech presentation. Her surgery went well and she was delivered of a viable female infant. She went to recover then [facility] where she received routine care. She has done well and is having no problems with voiding or eating. She does have some constipation. Her incision is clean and dry. Abdomen is soft and appropriately tender. HR RRR. Lungs wnl. Lochia normal. Ext Normal. She is deemed to have received the full benefit of her hospital stay and is discharged home.   Discharge Diagnoses: Term Pregnancy-delivered  Discharge Information: Date: 02/04/2012 Activity: unrestricted and pelvic rest Diet: routine Medications: Ibuprofen, Percocet and Miralaz Condition: stable Instructions: refer to practice specific booklet Discharge to: home Follow-up Information    Follow up with [facility]. Schedule an appointment as soon as possible for a visit in 6 weeks.         Newborn Data: Live born female  Birth Weight: 7 lb 6.3 oz (3355 g) APGAR: 8, 9  Home with mother.  [facility] 02/04/2012, 10:21 AM

## 2012-02-04 NOTE — Discharge Summary (Signed)
Attestation of Attending Supervision of Advanced Practitioner: Evaluation and management procedures were performed by the PA/NP/CNM/OB Fellow under my supervision/collaboration. Chart reviewed, and agree with management and plan.  Terrace Fontanilla, M.D. 02/04/2012 10:33 AM   

## 2014-09-30 ENCOUNTER — Encounter (HOSPITAL_COMMUNITY): Payer: Self-pay | Admitting: Obstetrics & Gynecology

## 2016-01-12 ENCOUNTER — Encounter (HOSPITAL_COMMUNITY)
Admission: AD | Disposition: A | Discharge status: Home / Self Care | Payer: Self-pay | Source: Ambulatory Visit | Attending: Family Medicine

## 2016-01-12 ENCOUNTER — Other Ambulatory Visit: Payer: Self-pay | Admitting: Family Medicine

## 2016-01-12 ENCOUNTER — Ambulatory Visit (HOSPITAL_COMMUNITY): Payer: Self-pay | Admitting: Anesthesiology

## 2016-01-12 ENCOUNTER — Ambulatory Visit (HOSPITAL_COMMUNITY)
Admission: AD | Admit: 2016-01-12 | Discharge: 2016-01-12 | Disposition: A | Discharge status: Home / Self Care | Payer: Self-pay | Source: Ambulatory Visit | Attending: Family Medicine | Admitting: Family Medicine

## 2016-01-12 DIAGNOSIS — Z3A13 13 weeks gestation of pregnancy: Secondary | ICD-10-CM

## 2016-01-12 DIAGNOSIS — O021 Missed abortion: Secondary | ICD-10-CM

## 2016-01-12 HISTORY — PX: DILATION AND EVACUATION: SHX1459

## 2016-01-12 LAB — CBC
HCT: 37.5 % (ref 36.0–46.0)
Hemoglobin: 13.2 g/dL (ref 12.0–15.0)
MCH: 30.6 pg (ref 26.0–34.0)
MCHC: 35.2 g/dL (ref 30.0–36.0)
MCV: 86.8 fL (ref 78.0–100.0)
PLATELETS: 252 10*3/uL (ref 150–400)
RBC: 4.32 MIL/uL (ref 3.87–5.11)
RDW: 11.9 % (ref 11.5–15.5)
WBC: 15.7 10*3/uL — AB (ref 4.0–10.5)

## 2016-01-12 SURGERY — DILATION AND EVACUATION, UTERUS
Anesthesia: Monitor Anesthesia Care | Site: Vagina

## 2016-01-12 MED ORDER — PROPOFOL 10 MG/ML IV BOLUS
INTRAVENOUS | Status: DC | PRN
  Administered 2016-01-12 (×2): 20 mg via INTRAVENOUS
  Administered 2016-01-12: 10 mg via INTRAVENOUS
  Administered 2016-01-12 (×2): 20 mg via INTRAVENOUS
  Administered 2016-01-12: 10 mg via INTRAVENOUS

## 2016-01-12 MED ORDER — SCOPOLAMINE 1 MG/3DAYS TD PT72
MEDICATED_PATCH | TRANSDERMAL | Status: AC
  Administered 2016-01-12: 1.5 mg via TRANSDERMAL
  Filled 2016-01-12: qty 1

## 2016-01-12 MED ORDER — FENTANYL CITRATE (PF) 250 MCG/5ML IJ SOLN
INTRAMUSCULAR | Status: AC
  Filled 2016-01-12: qty 5

## 2016-01-12 MED ORDER — BUPIVACAINE-EPINEPHRINE 0.25% -1:200000 IJ SOLN
INTRAMUSCULAR | Status: DC | PRN
  Administered 2016-01-12: 20 mL

## 2016-01-12 MED ORDER — PROPOFOL 10 MG/ML IV BOLUS
INTRAVENOUS | Status: AC
  Filled 2016-01-12: qty 20

## 2016-01-12 MED ORDER — OXYCODONE-ACETAMINOPHEN 5-325 MG PO TABS: 1.0000 | ORAL_TABLET | ORAL | Status: DC | PRN

## 2016-01-12 MED ORDER — ONDANSETRON HCL 4 MG/2ML IJ SOLN
INTRAMUSCULAR | Status: DC | PRN
  Administered 2016-01-12: 4 mg via INTRAVENOUS

## 2016-01-12 MED ORDER — DOXYCYCLINE HYCLATE 100 MG IV SOLR
200.0000 mg | Freq: Once | INTRAVENOUS | Status: AC
  Administered 2016-01-12: 200 mg via INTRAVENOUS
  Filled 2016-01-12: qty 200

## 2016-01-12 MED ORDER — KETOROLAC TROMETHAMINE 30 MG/ML IJ SOLN
INTRAMUSCULAR | Status: AC
  Filled 2016-01-12: qty 1

## 2016-01-12 MED ORDER — ONDANSETRON HCL 4 MG/2ML IJ SOLN
INTRAMUSCULAR | Status: AC
  Filled 2016-01-12: qty 2

## 2016-01-12 MED ORDER — PROMETHAZINE HCL 25 MG/ML IJ SOLN: 6.2500 mg | INTRAMUSCULAR | Status: DC | PRN

## 2016-01-12 MED ORDER — FENTANYL CITRATE (PF) 100 MCG/2ML IJ SOLN
INTRAMUSCULAR | Status: DC | PRN
  Administered 2016-01-12: 100 ug via INTRAVENOUS

## 2016-01-12 MED ORDER — KETOROLAC TROMETHAMINE 30 MG/ML IJ SOLN
INTRAMUSCULAR | Status: DC | PRN
  Administered 2016-01-12: 30 mg via INTRAVENOUS

## 2016-01-12 MED ORDER — LIDOCAINE HCL (CARDIAC) 20 MG/ML IV SOLN
INTRAVENOUS | Status: AC
Start: 2016-01-12 — End: 2016-01-12
  Filled 2016-01-12: qty 5

## 2016-01-12 MED ORDER — MIDAZOLAM HCL 2 MG/2ML IJ SOLN
INTRAMUSCULAR | Status: DC | PRN
  Administered 2016-01-12: 2 mg via INTRAVENOUS

## 2016-01-12 MED ORDER — BUPIVACAINE-EPINEPHRINE (PF) 0.25% -1:200000 IJ SOLN
INTRAMUSCULAR | Status: AC
  Filled 2016-01-12: qty 30

## 2016-01-12 MED ORDER — LACTATED RINGERS IV SOLN: 125.0000 mL/h | INTRAVENOUS | Status: DC

## 2016-01-12 MED ORDER — MIDAZOLAM HCL 2 MG/2ML IJ SOLN
INTRAMUSCULAR | Status: AC
  Filled 2016-01-12: qty 2

## 2016-01-12 MED ORDER — HYDROMORPHONE HCL 1 MG/ML IJ SOLN: 0.2500 mg | INTRAMUSCULAR | Status: DC | PRN

## 2016-01-12 MED ORDER — SCOPOLAMINE 1 MG/3DAYS TD PT72
1.0000 | MEDICATED_PATCH | Freq: Once | TRANSDERMAL | Status: DC
  Administered 2016-01-12: 1.5 mg via TRANSDERMAL

## 2016-01-12 MED ORDER — LIDOCAINE HCL (CARDIAC) 20 MG/ML IV SOLN
INTRAVENOUS | Status: DC | PRN
  Administered 2016-01-12: 80 mg via INTRAVENOUS

## 2016-01-12 MED ORDER — LACTATED RINGERS IV SOLN
INTRAVENOUS | Status: DC
  Administered 2016-01-12 (×2): via INTRAVENOUS

## 2016-01-12 MED ORDER — DEXAMETHASONE SODIUM PHOSPHATE 4 MG/ML IJ SOLN
INTRAMUSCULAR | Status: AC
  Filled 2016-01-12: qty 1

## 2016-01-12 MED ORDER — DEXAMETHASONE SODIUM PHOSPHATE 10 MG/ML IJ SOLN
INTRAMUSCULAR | Status: DC | PRN
  Administered 2016-01-12: 4 mg via INTRAVENOUS

## 2016-01-12 SURGICAL SUPPLY — 20 items
CATH ROBINSON RED A/P 16FR (CATHETERS) ×3 IMPLANT
CLOTH BEACON ORANGE TIMEOUT ST (SAFETY) ×3 IMPLANT
DECANTER SPIKE VIAL GLASS SM (MISCELLANEOUS) ×3 IMPLANT
GLOVE BIOGEL PI IND STRL 7.0 (GLOVE) ×1 IMPLANT
GLOVE BIOGEL PI IND STRL 7.5 (GLOVE) ×1 IMPLANT
GLOVE BIOGEL PI INDICATOR 7.0 (GLOVE) ×2
GLOVE BIOGEL PI INDICATOR 7.5 (GLOVE) ×2
GLOVE ECLIPSE 7.5 STRL STRAW (GLOVE) ×6 IMPLANT
GOWN STRL REUS W/TWL LRG LVL3 (GOWN DISPOSABLE) ×9 IMPLANT
KIT BERKELEY 1ST TRIMESTER 3/8 (MISCELLANEOUS) ×3 IMPLANT
NS IRRIG 1000ML POUR BTL (IV SOLUTION) ×3 IMPLANT
PACK VAGINAL MINOR WOMEN LF (CUSTOM PROCEDURE TRAY) ×3 IMPLANT
PAD OB MATERNITY 4.3X12.25 (PERSONAL CARE ITEMS) ×3 IMPLANT
PAD PREP 24X48 CUFFED NSTRL (MISCELLANEOUS) ×3 IMPLANT
SET BERKELEY SUCTION TUBING (SUCTIONS) ×3 IMPLANT
TOWEL OR 17X24 6PK STRL BLUE (TOWEL DISPOSABLE) ×6 IMPLANT
VACURETTE 10 RIGID CVD (CANNULA) ×2 IMPLANT
VACURETTE 7MM CVD STRL WRAP (CANNULA) IMPLANT
VACURETTE 8 RIGID CVD (CANNULA) IMPLANT
VACURETTE 9 RIGID CVD (CANNULA) ×2 IMPLANT

## 2016-01-12 NOTE — Anesthesia Postprocedure Evaluation (Signed)
Anesthesia Post Note  Patient: Emarie Castro-Vargas  Procedure(s) Performed: Procedure(s) (LRB): DILATATION AND EVACUATION (N/A)  Patient location during evaluation: PACU Anesthesia Type: MAC Level of consciousness: awake and alert Pain management: pain level controlled Vital Signs Assessment: post-procedure vital signs reviewed and stable Respiratory status: spontaneous breathing, nonlabored ventilation, respiratory function stable and patient connected to nasal cannula oxygen Cardiovascular status: stable and blood pressure returned to baseline Anesthetic complications: no    Last Vitals:  Filed Vitals:   01/12/16 1700 01/12/16 1715  BP: 104/76   Pulse: 82 85  Temp:    Resp: 18 14    Last Pain: There were no vitals filed for this visit.               Allysia Ingles DANIEL

## 2016-01-12 NOTE — Discharge Instructions (Signed)
Dilatacin y curetaje o curetaje por aspiracin, cuidados posteriores (Dilation and Curettage or Vacuum Curettage, Care After) Siga estas instrucciones durante las prximas semanas. Estas indicaciones le proporcionan informacin acerca de cmo deber cuidarse despus del procedimiento. El mdico tambin podr darle instrucciones ms especficas. El tratamiento se ha planificado de acuerdo a las prcticas mdicas actuales, pero a veces se producen problemas. Comunquese con el mdico si tiene algn problema o tiene dudas despus del procedimiento. QU ESPERAR DESPUS DEL PROCEDIMIENTO Despus del procedimiento, es tpico tener clicos. Pueden durar de 2das a 2semanas despus del procedimiento. INSTRUCCIONES PARA EL CUIDADO EN EL HOGAR   No conduzca durante 24horas.  Espere una semana antes de [employer] actividades intensas.  Tmese la [employer] veces al da, durante 4 das y Engineering geologist. Si tiene fiebre, informe estas temperaturas a su mdico.  Advertising account planner de pie durante largos perodos.  Evite levantar, tironear o empujar objetos pesados. No levante ningn objeto que pese ms de 10 libras (4,5 kg).  Solo suba escaleras una o dos veces por da.  Tome con frecuencia momentos de descanso.  Puede retomar su dieta habitual.  Beba suficiente lquido para mantener la orina clara o de color amarillo plido.  La funcin intestinal normal se restablecer. Si est estreida, podr:  Tomar un laxante suave con autorizacin de su mdico.  Agregar frutas y salvado a su dieta.  Beber ms lquidos.  Tome Museum/gallery curator de baos de inmersin hasta que el mdico la autorice.  No practique natacin ni utilice el jacuzzi hasta que el mdico la autorice.  Pdale a alguna persona que permanezca con usted durante las primeras 24 a 48 horas, especialmente si le han administrado anestesia general.  No se haga duchas vaginales, no use tampones ni tenga sexo (relaciones sexuales) durante  las 2semanas siguientes al procedimiento.  Tome solo medicamentos de venta libre o recetados, segn las indicaciones del mdico. No tome aspirina. Puede ocasionar hemorragias.  Concurra a las consultas de control con su mdico segn las indicaciones. SOLICITE ATENCIN MDICA SI:   Siente clicos o el dolor no se Chief of Staff.  Siente dolor abdominal que no parece estar relacionado con el rea en que senta los clicos y Chief Technology Officer anteriormente.  Brett Fairy secrecin vaginal con mal olor.  Tiene una erupcin cutnea.  Tiene problemas con algn medicamento. SOLICITE ATENCIN MDICA DE INMEDIATO SI:   Tiene una hemorragia ms abundante que un perodo menstrual normal.  Tiene fiebre.  Siente dolor en el pecho.  Le falta el aire.  Se siente mareada o siente como si fuera a desmayarse.  Se desmaya.  Siente dolor en la zona de los breteles de los hombros.  Tiene una hemorragia vaginal abundante, con o sin cogulos sanguneos. ASEGRESE DE QUE:   Comprende estas instrucciones.  Controlar su afeccin.  Recibir ayuda de inmediato si no mejora o si empeora.   Esta informacin no tiene Theme park manager el consejo del mdico. Asegrese de hacerle al mdico cualquier pregunta que tenga.   Document Released: 08/25/2005 Document Revised: 11/20/2013 Elsevier Interactive Patient Education 2016 [employer].  Post Anesthesia Home Care Instructions  Activity: Get plenty of rest for the remainder of the day. A responsible adult should stay with you for 24 hours following the procedure.  For the next 24 hours, DO NOT: -Drive a car -Advertising copywriter -Drink alcoholic beverages -Take any medication unless instructed by your physician -Make any legal decisions or sign important papers.  Meals: Start with  liquid foods such as gelatin or soup. Progress to regular foods as tolerated. Avoid greasy, spicy, heavy foods. If nausea and/or vomiting occur, drink only clear  liquids until the nausea and/or vomiting subsides. Call your physician if vomiting continues.  Special Instructions/Symptoms: Your throat may feel dry or sore from the anesthesia or the breathing tube placed in your throat during surgery. If this causes discomfort, gargle with warm salt water. The discomfort should disappear within 24 hours.  If you had a scopolamine patch placed behind your ear for the management of post- operative nausea and/or vomiting:  1. The medication in the patch is effective for 72 hours, after which it should be removed.  Wrap patch in a tissue and discard in the trash. Wash hands thoroughly with soap and water. 2. You may remove the patch earlier than 72 hours if you experience unpleasant side effects which may include dry mouth, dizziness or visual disturbances. 3. Avoid touching the patch. Wash your hands with soap and water after contact with the patch.

## 2016-01-12 NOTE — H&P (View-Only) (Signed)
Preoperative History and Physical  Dawn [name] is a 35 y.o. [phone] with a IUP measuring 12weeks by [place] today without cardiac activity.  She is here for surgical management of missed abortion.   No significant preoperative concerns.  Proposed surgery: Dilation and Evacuation  Past Medical History  Diagnosis Date  . No pertinent past medical history   . Language barrier     Hospital Interpreter Eda Royal assisted with pat history   Past Surgical History  Procedure Laterality Date  . No past surgeries    . Vaginal delivery  x2  . Cesarean section  02/01/2012    Procedure: CESAREAN SECTION;  Surgeon: Scheryl Darter, MD;  Location: [facility];  Service: Gynecology;  Laterality: N/A;  ultrasound prior c/s   OB History    Gravida Para Term Preterm AB TAB SAB Ectopic Multiple Living   Patient denies any cervical dysplasia or STIs. Current Outpatient Prescriptions on File Prior to Visit  Medication Sig Dispense Refill  . Prenatal Vit-Fe Fumarate-FA (PRENATAL MULTIVITAMIN) TABS Take 1 tablet by mouth daily.     No current facility-administered medications on file prior to visit.   No Known Allergies Social History:   reports that she has never smoked. She does not have any smokeless tobacco history on file. She reports that she does not drink alcohol or use illicit drugs.  No family history on file.  Review of Systems: Full 10 systems review of systems preformed, which were normal other than what was stated in the HPI.  PHYSICAL EXAM: unknown if currently breastfeeding. General appearance - alert, well appearing, and in no distress Head - Normocephalic, atraumatic.  Right and left external ears normal. Eyes - EOMI.  Nonicteric.  Normal conjunctiva Neck - supple, no lymphadenopathy.  No tracheal deviation Chest - clear to auscultation, no wheezes, rales or rhonchi, symmetric air entry Heart - normal rate and regular rhythm Abdomen - soft, nontender, nondistended,  no masses or organomegaly Pelvic - examination not indicated Extremities - peripheral pulses normal, no pedal edema, no clubbing or cyanosis Skin - Warm to touch. no bruises, rashes, wounds. Neuro - Oriented x3.  Cranial nerves intact. Psych - normal thought process.  Judgement intact.  Labs: No results found for this or any previous visit (from the past 336 hour(s)).  Imaging Studies: No results found.  Assessment: Patient Active Problem List   Diagnosis Date Noted  . Missed abortion 01/12/2016  . History of cesarean delivery 02/04/2012    Plan: Patient will undergo surgical management of missed abortion with dilation and evacuation.   The risks of surgery were discussed in detail with the patient including but not limited to: bleeding which may require transfusion or reoperation; infection which may require antibiotics; injury to surrounding organs which may involve bowel, bladder, ureters ; need for additional procedures including laparoscopy or laparotomy; thromboembolic phenomenon, surgical site problems and other postoperative/anesthesia complications. Likelihood of success in alleviating the patient's condition was discussed. Routine postoperative instructions will be reviewed with the patient and her family in detail after surgery.  The patient concurred with the proposed plan, giving informed written consent for the surgery.  Patient has been NPO since last night she will remain NPO for procedure.  Anesthesia and OR aware.  Preoperative prophylactic antibiotics and SCDs ordered on call to the OR.  To OR when ready.  Levie Heritage, DO  01/12/2016, 11:22 AM

## 2016-01-12 NOTE — Anesthesia Preprocedure Evaluation (Signed)
Anesthesia Evaluation  Patient identified by MRN, date of birth, ID band Patient awake    Reviewed: Allergy & Precautions, H&P , NPO status , Patient's Chart, lab work & pertinent test results  Airway Mallampati: I  TM Distance: >3 FB Neck ROM: full    Dental  (+) Teeth Intact, Dental Advidsory Given   Pulmonary neg pulmonary ROS,    breath sounds clear to auscultation       Cardiovascular negative cardio ROS   Rhythm:regular Rate:Normal     Neuro/Psych negative neurological ROS  negative psych ROS   GI/Hepatic negative GI ROS, Neg liver ROS,   Endo/Other  negative endocrine ROS  Renal/GU negative Renal ROS     Musculoskeletal   Abdominal   Peds  Hematology   Anesthesia Other Findings   Reproductive/Obstetrics negative OB ROS                             Anesthesia Physical Anesthesia Plan  ASA: II  Anesthesia Plan: MAC   Post-op Pain Management:    Induction:   Airway Management Planned:   Additional Equipment:   Intra-op Plan:   Post-operative Plan:   Informed Consent: I have reviewed the patients History and Physical, chart, labs and discussed the procedure including the risks, benefits and alternatives for the proposed anesthesia with the patient or authorized representative who has indicated his/her understanding and acceptance.   Dental Advisory Given  Plan Discussed with: Anesthesiologist, CRNA and Surgeon  Anesthesia Plan Comments:         Anesthesia Quick Evaluation

## 2016-01-12 NOTE — H&P (Addendum)
Preoperative History and Physical  Dawn [name] is a 35 y.o. [phone] with a IUP measuring 12weeks by [place] today without cardiac activity.  She is here for surgical management of missed abortion.   No significant preoperative concerns.  Proposed surgery: Dilation and Evacuation  Past Medical History  Diagnosis Date  . No pertinent past medical history   . Language barrier     Hospital Interpreter Eda Royal assisted with pat history   Past Surgical History  Procedure Laterality Date  . No past surgeries    . Vaginal delivery  x2  . Cesarean section  02/01/2012    Procedure: CESAREAN SECTION;  Surgeon: Scheryl Darter, MD;  Location: [facility];  Service: Gynecology;  Laterality: N/A;  ultrasound prior c/s   OB History    Gravida Para Term Preterm AB TAB SAB Ectopic Multiple Living   Patient denies any cervical dysplasia or STIs. Current Outpatient Prescriptions on File Prior to Visit  Medication Sig Dispense Refill  . Prenatal Vit-Fe Fumarate-FA (PRENATAL MULTIVITAMIN) TABS Take 1 tablet by mouth daily.     No current facility-administered medications on file prior to visit.   No Known Allergies Social History:   reports that she has never smoked. She does not have any smokeless tobacco history on file. She reports that she does not drink alcohol or use illicit drugs.  No family history on file.  Review of Systems: Full 10 systems review of systems preformed, which were normal other than what was stated in the HPI.  PHYSICAL EXAM: unknown if currently breastfeeding. General appearance - alert, well appearing, and in no distress Head - Normocephalic, atraumatic.  Right and left external ears normal. Eyes - EOMI.  Nonicteric.  Normal conjunctiva Neck - supple, no lymphadenopathy.  No tracheal deviation Chest - clear to auscultation, no wheezes, rales or rhonchi, symmetric air entry Heart - normal rate and regular rhythm Abdomen - soft, nontender, nondistended,  no masses or organomegaly Pelvic - examination not indicated Extremities - peripheral pulses normal, no pedal edema, no clubbing or cyanosis Skin - Warm to touch. no bruises, rashes, wounds. Neuro - Oriented x3.  Cranial nerves intact. Psych - normal thought process.  Judgement intact.  Labs: No results found for this or any previous visit (from the past 336 hour(s)).  Imaging Studies: No results found.  Assessment: Patient Active Problem List   Diagnosis Date Noted  . Missed abortion 01/12/2016  . History of cesarean delivery 02/04/2012    Plan: Patient will undergo surgical management of missed abortion with dilation and evacuation.   The risks of surgery were discussed in detail with the patient including but not limited to: bleeding which may require transfusion or reoperation; infection which may require antibiotics; injury to surrounding organs which may involve bowel, bladder, ureters ; need for additional procedures including laparoscopy or laparotomy; thromboembolic phenomenon, surgical site problems and other postoperative/anesthesia complications. Likelihood of success in alleviating the patient's condition was discussed. Routine postoperative instructions will be reviewed with the patient and her family in detail after surgery.  The patient concurred with the proposed plan, giving informed written consent for the surgery.  Patient has been NPO since last night she will remain NPO for procedure.  Anesthesia and OR aware.  Preoperative prophylactic antibiotics and SCDs ordered on call to the OR.  To OR when ready.  Levie Heritage, DO  01/12/2016, 11:22 AM    H&P  above is updated today as follows: Patient underwent suction D&C 01/12/16. She has mild cramping and minimal bleeding. US done 01/14/16 showed retained POC and she was scheduled for D&E today. The risks of surgery were discussed in detail with the patient including but not limited to: bleeding which may require  transfusion or reoperation; infection which may require antibiotics; injury to surrounding organs which may involve bowel, bladder, ureters ; need for additional procedures including laparoscopy or laparotomy; thromboembolic phenomenon, surgical site problems and other postoperative/anesthesia complications. Likelihood of success in alleviating the patient's condition was discussed. Routine postoperative instructions will be reviewed with the patient and her family in detail after surgery.  The patient concurred with the proposed plan, giving informed written consent for the surgery.  Patient has been NPO since last night she will remain NPO for procedure.  Anesthesia and OR aware.  Preoperative prophylactic antibiotics and SCDs ordered on call to the OR.  To OR when ready. CLINICAL DATA: Retained products of conception following abortion  EXAM: TRANSABDOMINAL ULTRASOUND OF PELVIS  TECHNIQUE: Transabdominal ultrasound examination of the pelvis was performed including evaluation of the uterus, ovaries, adnexal regions, and pelvic cul-de-sac.  COMPARISON: None.  FINDINGS: Uterus  Measurements: 11.3 x 6.8 x 8.5 cm. No fibroids or other mass visualized.  Endometrium  Thickness: Thickened endometrium measuring up to 18 mm. Heterogeneous echogenic material seen within the endometrium.  Right ovary  Measurements: 2.1 x 0.9 x 1.6 cm. Normal appearance/no adnexal mass.  Left ovary  Measurements: 1.9 x 1.1 x 1.4 cm. Normal appearance/no adnexal mass.  Other findings: No abnormal free fluid.  IMPRESSION: Heterogeneous/echogenic material within the thickened endometrium concerning for retained products of conception.   Electronically Signed  By: Charlett Nose M.D.   Adam Phenix, MD 01/15/2016 11:04 AM

## 2016-01-12 NOTE — Op Note (Signed)
Dawn [name] PROCEDURE DATE: 01/12/2016  PREOPERATIVE DIAGNOSIS: Approximately 13 week missed abortion POSTOPERATIVE DIAGNOSIS: The same PROCEDURE:     Dilation and Evacuation SURGEON:  Dr. Candelaria Celeste, primary; Dr. Shonna Chock, assisting  INDICATIONS: 35 y.o. [phone] with MAB at approximately [redacted] weeks gestation, needing surgical completion.  Risks of surgery were discussed with the patient including but not limited to: bleeding which may require transfusion; infection which may require antibiotics; injury to uterus or surrounding organs; need for additional procedures including laparotomy or laparoscopy; possibility of intrauterine scarring which may impair future fertility; and other postoperative/anesthesia complications. Written informed consent was obtained.    FINDINGS:  A 13 week size uterus, moderate amounts of products of conception, specimen sent to pathology.  ANESTHESIA:    Monitored intravenous sedation, paracervical block. INTRAVENOUS FLUIDS:  1100 ml of LR ESTIMATED BLOOD LOSS:  Less than 20 ml. SPECIMENS:  Products of conception sent to pathology COMPLICATIONS:  None immediate.  PROCEDURE DETAILS:  The patient was then taken to the operating room where monitored intravenous sedation was administered and was found to be adequate.  After an adequate timeout was performed, she was placed in the dorsal lithotomy position and examined; then prepped and draped in the sterile manner.   Her bladder was catheterized for an unmeasured amount of clear, yellow urine. A vaginal speculum was then placed in the patient's vagina and a single tooth tenaculum was applied to the anterior lip of the cervix.  A paracervical block using 20 ml of 0.25% Marcaine w/ epinephrine was administered. The cervix was gently dilated to accommodate a 9 mm suction curette that was gently advanced to the uterine fundus.  The suction device was then activated and curette slowly rotated to clear the uterus of  products of conception, and this process was repeated until all products of conception were removed.  A sharp curettage was then performed to confirm complete emptying of the uterus. There was minimal bleeding noted and the tenaculum removed with good hemostasis noted.   All instruments were removed from the patient's vagina.  Sponge and instrument counts were correct times two.  The patient tolerated the procedure well and was taken to the recovery area awake, and in stable condition.  The patient will be discharged to home as per PACU criteria.

## 2016-01-12 NOTE — Interval H&P Note (Signed)
History and Physical Interval Note:  01/12/2016 1:33 PM  Dawn [name]  has presented today for surgery, with the diagnosis of missed AB about 12-14weeks   The various methods of treatment have been discussed with the patient and family. After consideration of risks, benefits and other options for treatment, the patient has consented to  Procedure(s) with comments: DILATATION AND EVACUATION (N/A) - missed AB 12-14 weeks  as a surgical intervention .  The patient's history has been reviewed, patient examined, no change in status, stable for surgery.  I have reviewed the patient's chart and labs.  Questions were answered to the patient's satisfaction.     Elanor Cale JEHIEL

## 2016-01-12 NOTE — Transfer of Care (Signed)
Immediate Anesthesia Transfer of Care Note  Patient: Dawn [name]  Procedure(s) Performed: Procedure(s) with comments: DILATATION AND EVACUATION (N/A) - missed AB 12-14 weeks   Patient Location: PACU  Anesthesia Type:MAC  Level of Consciousness: sedated  Airway & Oxygen Therapy: Patient Spontanous Breathing  Post-op Assessment: Report given to RN and Post -op Vital signs reviewed and stable  Post vital signs: Reviewed and stable  Last Vitals:  Filed Vitals:   01/12/16 1309 01/12/16 1443  BP:  107/69  Pulse: 100   Temp: 36.8 C   Resp: 20     Complications: No apparent anesthesia complications

## 2016-01-13 ENCOUNTER — Encounter (HOSPITAL_COMMUNITY): Payer: Self-pay | Admitting: Family Medicine

## 2016-01-13 ENCOUNTER — Other Ambulatory Visit: Payer: Self-pay | Admitting: Obstetrics & Gynecology

## 2016-01-13 ENCOUNTER — Ambulatory Visit (HOSPITAL_COMMUNITY): Payer: Medicaid Other

## 2016-01-13 DIAGNOSIS — O034 Incomplete spontaneous abortion without complication: Secondary | ICD-10-CM

## 2016-01-14 ENCOUNTER — Encounter (HOSPITAL_COMMUNITY): Payer: Self-pay | Admitting: *Deleted

## 2016-01-14 ENCOUNTER — Ambulatory Visit (HOSPITAL_COMMUNITY)
Admission: RE | Admit: 2016-01-14 | Discharge: 2016-01-14 | Disposition: A | Discharge status: Home / Self Care | Payer: Self-pay | Source: Ambulatory Visit | Attending: Obstetrics & Gynecology | Admitting: Obstetrics & Gynecology

## 2016-01-14 DIAGNOSIS — O034 Incomplete spontaneous abortion without complication: Secondary | ICD-10-CM | POA: Insufficient documentation

## 2016-01-15 ENCOUNTER — Ambulatory Visit (HOSPITAL_COMMUNITY): Payer: Medicaid Other | Admitting: Anesthesiology

## 2016-01-15 ENCOUNTER — Encounter (HOSPITAL_COMMUNITY)
Admission: RE | Disposition: A | Discharge status: Home / Self Care | Payer: Self-pay | Source: Ambulatory Visit | Attending: Obstetrics & Gynecology

## 2016-01-15 ENCOUNTER — Encounter (HOSPITAL_COMMUNITY): Payer: Self-pay | Admitting: *Deleted

## 2016-01-15 ENCOUNTER — Ambulatory Visit (HOSPITAL_COMMUNITY)
Admission: RE | Admit: 2016-01-15 | Discharge: 2016-01-15 | Disposition: A | Discharge status: Home / Self Care | Payer: Medicaid Other | Source: Ambulatory Visit | Attending: Obstetrics & Gynecology | Admitting: Obstetrics & Gynecology

## 2016-01-15 DIAGNOSIS — O021 Missed abortion: Secondary | ICD-10-CM | POA: Diagnosis present

## 2016-01-15 DIAGNOSIS — O034 Incomplete spontaneous abortion without complication: Secondary | ICD-10-CM | POA: Insufficient documentation

## 2016-01-15 HISTORY — PX: DILATION AND EVACUATION: SHX1459

## 2016-01-15 SURGERY — DILATION AND EVACUATION, UTERUS
Anesthesia: Monitor Anesthesia Care | Site: Vagina

## 2016-01-15 MED ORDER — MIDAZOLAM HCL 2 MG/2ML IJ SOLN
INTRAMUSCULAR | Status: AC
  Filled 2016-01-15: qty 2

## 2016-01-15 MED ORDER — DEXAMETHASONE SODIUM PHOSPHATE 10 MG/ML IJ SOLN
INTRAMUSCULAR | Status: DC | PRN
  Administered 2016-01-15: 4 mg via INTRAVENOUS

## 2016-01-15 MED ORDER — PROPOFOL 500 MG/50ML IV EMUL
INTRAVENOUS | Status: DC | PRN
  Administered 2016-01-15: 75 ug/kg/min via INTRAVENOUS

## 2016-01-15 MED ORDER — PROMETHAZINE HCL 25 MG/ML IJ SOLN: 6.2500 mg | INTRAMUSCULAR | Status: DC | PRN

## 2016-01-15 MED ORDER — ONDANSETRON HCL 4 MG/2ML IJ SOLN
INTRAMUSCULAR | Status: DC | PRN
  Administered 2016-01-15: 4 mg via INTRAVENOUS

## 2016-01-15 MED ORDER — MEPERIDINE HCL 25 MG/ML IJ SOLN: 6.2500 mg | INTRAMUSCULAR | Status: DC | PRN

## 2016-01-15 MED ORDER — BUPIVACAINE-EPINEPHRINE (PF) 0.5% -1:200000 IJ SOLN
INTRAMUSCULAR | Status: AC
  Filled 2016-01-15: qty 30

## 2016-01-15 MED ORDER — OXYCODONE HCL 5 MG/5ML PO SOLN: 5.0000 mg | Freq: Once | ORAL | Status: DC | PRN

## 2016-01-15 MED ORDER — LIDOCAINE HCL (CARDIAC) 20 MG/ML IV SOLN
INTRAVENOUS | Status: DC | PRN
Start: 2016-01-15 — End: 2016-01-15
  Administered 2016-01-15 (×2): 30 mg via INTRAVENOUS

## 2016-01-15 MED ORDER — PROPOFOL 500 MG/50ML IV EMUL
INTRAVENOUS | Status: DC | PRN
  Administered 2016-01-15 (×3): 20 mg via INTRAVENOUS
  Administered 2016-01-15 (×2): 50 mg via INTRAVENOUS

## 2016-01-15 MED ORDER — FENTANYL CITRATE (PF) 100 MCG/2ML IJ SOLN
INTRAMUSCULAR | Status: AC
  Filled 2016-01-15: qty 2

## 2016-01-15 MED ORDER — SCOPOLAMINE 1 MG/3DAYS TD PT72
1.0000 | MEDICATED_PATCH | Freq: Once | TRANSDERMAL | Status: DC
  Administered 2016-01-15: 1.5 mg via TRANSDERMAL

## 2016-01-15 MED ORDER — OXYCODONE HCL 5 MG PO TABS: 5.0000 mg | ORAL_TABLET | Freq: Once | ORAL | Status: DC | PRN

## 2016-01-15 MED ORDER — FENTANYL CITRATE (PF) 100 MCG/2ML IJ SOLN
INTRAMUSCULAR | Status: DC | PRN
  Administered 2016-01-15 (×2): 50 ug via INTRAVENOUS

## 2016-01-15 MED ORDER — BUPIVACAINE-EPINEPHRINE 0.5% -1:200000 IJ SOLN
INTRAMUSCULAR | Status: DC | PRN
  Administered 2016-01-15: 20 mL

## 2016-01-15 MED ORDER — LACTATED RINGERS IV SOLN: INTRAVENOUS | Status: DC

## 2016-01-15 MED ORDER — LACTATED RINGERS IV SOLN
INTRAVENOUS | Status: DC
  Administered 2016-01-15 (×2): via INTRAVENOUS

## 2016-01-15 MED ORDER — SCOPOLAMINE 1 MG/3DAYS TD PT72
MEDICATED_PATCH | TRANSDERMAL | Status: AC
  Administered 2016-01-15: 1.5 mg via TRANSDERMAL
  Filled 2016-01-15: qty 1

## 2016-01-15 MED ORDER — MIDAZOLAM HCL 2 MG/2ML IJ SOLN
INTRAMUSCULAR | Status: DC | PRN
  Administered 2016-01-15 (×2): 1 mg via INTRAVENOUS

## 2016-01-15 MED ORDER — KETOROLAC TROMETHAMINE 30 MG/ML IJ SOLN
INTRAMUSCULAR | Status: DC | PRN
  Administered 2016-01-15: 30 mg via INTRAVENOUS

## 2016-01-15 MED ORDER — DOXYCYCLINE HYCLATE 100 MG IV SOLR
200.0000 mg | INTRAVENOUS | Status: AC
  Administered 2016-01-15: 200 mg via INTRAVENOUS
  Filled 2016-01-15: qty 200

## 2016-01-15 MED ORDER — HYDROMORPHONE HCL 1 MG/ML IJ SOLN: 0.2500 mg | INTRAMUSCULAR | Status: DC | PRN

## 2016-01-15 SURGICAL SUPPLY — 20 items
CATH ROBINSON RED A/P 16FR (CATHETERS) ×3 IMPLANT
CLOTH BEACON ORANGE TIMEOUT ST (SAFETY) ×3 IMPLANT
DECANTER SPIKE VIAL GLASS SM (MISCELLANEOUS) IMPLANT
GLOVE BIO SURGEON STRL SZ 6.5 (GLOVE) ×2 IMPLANT
GLOVE BIO SURGEONS STRL SZ 6.5 (GLOVE) ×1
GLOVE BIOGEL PI IND STRL 7.0 (GLOVE) ×2 IMPLANT
GLOVE BIOGEL PI INDICATOR 7.0 (GLOVE) ×4
GOWN STRL REUS W/TWL LRG LVL3 (GOWN DISPOSABLE) ×6 IMPLANT
KIT BERKELEY 1ST TRIMESTER 3/8 (MISCELLANEOUS) IMPLANT
NS IRRIG 1000ML POUR BTL (IV SOLUTION) ×3 IMPLANT
PACK VAGINAL MINOR WOMEN LF (CUSTOM PROCEDURE TRAY) ×3 IMPLANT
PAD OB MATERNITY 4.3X12.25 (PERSONAL CARE ITEMS) ×3 IMPLANT
PAD PREP 24X48 CUFFED NSTRL (MISCELLANEOUS) ×3 IMPLANT
SET BERKELEY SUCTION TUBING (SUCTIONS) IMPLANT
TOWEL OR 17X24 6PK STRL BLUE (TOWEL DISPOSABLE) ×6 IMPLANT
VACURETTE 10 RIGID CVD (CANNULA) IMPLANT
VACURETTE 12 RIGID CVD (CANNULA) ×2 IMPLANT
VACURETTE 7MM CVD STRL WRAP (CANNULA) IMPLANT
VACURETTE 8 RIGID CVD (CANNULA) IMPLANT
VACURETTE 9 RIGID CVD (CANNULA) IMPLANT

## 2016-01-15 NOTE — OR Nursing (Signed)
Hospital interpretor Eda interprets for pt. Pt states she is comfortable. Pt asked if she has questions, no questions at this time. [name] rn

## 2016-01-15 NOTE — Op Note (Signed)
Sharetha Castro-Vargas PROCEDURE DATE: 01/15/2016  PREOPERATIVE DIAGNOSIS: retained products of conception s/p dilation and evacuation for missed ~13 week abortion POSTOPERATIVE DIAGNOSIS: The same PROCEDURE:     Dilation and Evacuation SURGEON:  Dr. Scheryl Darter, primary; Dr. Shonna Chock, assisting  INDICATIONS: 35 y.o. [phone] with MAB at approximately [redacted] weeks gestation, who underwent dilation and evacuation 2 days ago, found to have retained products of conception.  Risks of surgery were discussed with the patient including but not limited to: bleeding which may require transfusion; infection which may require antibiotics; injury to uterus or surrounding organs; need for additional procedures including laparotomy or laparoscopy; possibility of intrauterine scarring which may impair future fertility; and other postoperative/anesthesia complications. Written informed consent was obtained.    FINDINGS:  Moderate amounts of products of conception, specimen sent to pathology.  ANESTHESIA:    Monitored intravenous sedation, paracervical block. INTRAVENOUS FLUIDS:  900 ml of LR ESTIMATED BLOOD LOSS:  Less than 20 ml. SPECIMENS:  Products of conception sent to pathology COMPLICATIONS:  None immediate.  PROCEDURE DETAILS:  The patient received intravenous Doxycycline while in the preoperative area.  She was then taken to the operating room where monitored intravenous sedation was administered and was found to be adequate.  After an adequate timeout was performed, she was placed in the dorsal lithotomy position and examined; then prepped and draped in the sterile manner.   Her bladder was catheterized for an unmeasured amount of clear, yellow urine. A vaginal speculum was then placed in the patient's vagina and a single tooth tenaculum was applied to the anterior lip of the cervix.  A paracervical block using 30 ml of 0.5% Marcaine with epinephrine was administered. The cervix was gently dilated to  accommodate a 12 mm suction curette that was gently advanced to the uterine fundus.  The suction device was then activated and curette slowly rotated to clear the uterus of products of conception. Transabdominal ultrasound confirmed evacuated uterus. There was minimal bleeding noted and the tenaculum removed with good hemostasis noted.   All instruments were removed from the patient's vagina.  Sponge and instrument counts were correct times two.  The patient tolerated the procedure well and was taken to the recovery area awake, and in stable condition.

## 2016-01-15 NOTE — Anesthesia Postprocedure Evaluation (Signed)
Anesthesia Post Note  Patient: Dawn [name]  Procedure(s) Performed: Procedure(s) (LRB): DILATATION AND EVACUATION (N/A)  Patient location during evaluation: PACU Anesthesia Type: MAC Level of consciousness: awake and alert Pain management: pain level controlled Vital Signs Assessment: post-procedure vital signs reviewed and stable Respiratory status: spontaneous breathing, nonlabored ventilation, respiratory function stable and patient connected to nasal cannula oxygen Cardiovascular status: blood pressure returned to baseline and stable Postop Assessment: no signs of nausea or vomiting Anesthetic complications: no    Last Vitals:  Filed Vitals:   01/15/16 1415 01/15/16 1427  BP: 116/78   Pulse: 77 73  Temp:    Resp: 19 18    Last Pain:  Filed Vitals:   01/15/16 1428  PainSc: 0-No pain                 Yandriel Boening JENNETTE

## 2016-01-15 NOTE — OR Nursing (Signed)
Hospital interpretor interprets instructions for pt to advance to phase 2. [name] rn

## 2016-01-15 NOTE — Anesthesia Preprocedure Evaluation (Addendum)
Anesthesia Evaluation  Patient identified by MRN, date of birth, ID band Patient awake    Reviewed: Allergy & Precautions, H&P , NPO status , Patient's Chart, lab work & pertinent test results  History of Anesthesia Complications Negative for: history of anesthetic complications  Airway Mallampati: I  TM Distance: >3 FB Neck ROM: full    Dental  (+) Teeth Intact, Dental Advidsory Given   Pulmonary neg pulmonary ROS,    breath sounds clear to auscultation       Cardiovascular negative cardio ROS   Rhythm:regular Rate:Normal     Neuro/Psych negative neurological ROS  negative psych ROS   GI/Hepatic negative GI ROS, Neg liver ROS,   Endo/Other  negative endocrine ROS  Renal/GU negative Renal ROS     Musculoskeletal   Abdominal   Peds  Hematology   Anesthesia Other Findings   Reproductive/Obstetrics negative OB ROS                            Anesthesia Physical  Anesthesia Plan  ASA: II  Anesthesia Plan: MAC   Post-op Pain Management:    Induction:   Airway Management Planned:   Additional Equipment:   Intra-op Plan:   Post-operative Plan:   Informed Consent: I have reviewed the patients History and Physical, chart, labs and discussed the procedure including the risks, benefits and alternatives for the proposed anesthesia with the patient or authorized representative who has indicated his/her understanding and acceptance.   Dental Advisory Given  Plan Discussed with: Anesthesiologist, CRNA and Surgeon  Anesthesia Plan Comments:         Anesthesia Quick Evaluation

## 2016-01-15 NOTE — OR Nursing (Signed)
Hospital spanish interpretor comes back to interpret for patient to see if pt has any questions. [name] rn

## 2016-01-15 NOTE — Transfer of Care (Signed)
Immediate Anesthesia Transfer of Care Note  Patient: Dawn [name]  Procedure(s) Performed: Procedure(s): DILATATION AND EVACUATION (N/A)  Patient Location: PACU  Anesthesia Type:MAC  Level of Consciousness: awake, alert  and oriented  Airway & Oxygen Therapy: Patient Spontanous Breathing and Patient connected to nasal cannula oxygen  Post-op Assessment: Report given to RN, Post -op Vital signs reviewed and stable and Patient moving all extremities  Post vital signs: Reviewed and stable  Last Vitals:  Filed Vitals:   01/15/16 1025  BP: 117/78  Pulse: 63  Temp: 36.8 C  Resp: 16    Complications: No apparent anesthesia complications

## 2016-01-15 NOTE — Discharge Instructions (Signed)

## 2016-01-15 NOTE — OR Nursing (Signed)
In phase 2 waiting for her ride home

## 2016-01-16 ENCOUNTER — Encounter (HOSPITAL_COMMUNITY): Payer: Self-pay | Admitting: Obstetrics & Gynecology

## 2016-01-24 NOTE — H&P (Signed)
Expand All Collapse All   Preoperative History and Physical  Dawn [name] is a 35 y.o. [phone] with a IUP measuring 12weeks by [place] today without cardiac activity. She is here for surgical management of missed abortion. No significant preoperative concerns.  Proposed surgery: Dilation and Evacuation  Past Medical History  Diagnosis Date  . No pertinent past medical history   . Language barrier     Hospital Interpreter Eda Royal assisted with pat history   Past Surgical History  Procedure Laterality Date  . No past surgeries    . Vaginal delivery  x2  . Cesarean section  02/01/2012    Procedure: CESAREAN SECTION; Surgeon: Scheryl Darter, MD; Location: [facility]; Service: Gynecology; Laterality: N/A; ultrasound prior c/s   OB History    Gravida Para Term Preterm AB TAB SAB Ectopic Multiple Living   Patient denies any cervical dysplasia or STIs. Current Outpatient Prescriptions on File Prior to Visit  Medication Sig Dispense Refill  . Prenatal Vit-Fe Fumarate-FA (PRENATAL MULTIVITAMIN) TABS Take 1 tablet by mouth daily.     No current facility-administered medications on file prior to visit.   No Known Allergies Social History:  reports that she has never smoked. She does not have any smokeless tobacco history on file. She reports that she does not drink alcohol or use illicit drugs.  No family history on file.  Review of Systems: Full 10 systems review of systems preformed, which were normal other than what was stated in the HPI.  PHYSICAL EXAM: unknown if currently breastfeeding. General appearance - alert, well appearing, and in no distress Head - Normocephalic, atraumatic. Right and left external ears normal. Eyes - EOMI. Nonicteric. Normal conjunctiva Neck - supple, no lymphadenopathy. No tracheal deviation Chest - clear to auscultation, no wheezes, rales or rhonchi,  symmetric air entry Heart - normal rate and regular rhythm Abdomen - soft, nontender, nondistended, no masses or organomegaly Pelvic - examination not indicated Extremities - peripheral pulses normal, no pedal edema, no clubbing or cyanosis Skin - Warm to touch. no bruises, rashes, wounds. Neuro - Oriented x3. Cranial nerves intact. Psych - normal thought process. Judgement intact.  Labs: No results found for this or any previous visit (from the past 336 hour(s)).  Imaging Studies:  Imaging Results    No results found.    Assessment: Patient Active Problem List   Diagnosis Date Noted  . Missed abortion 01/12/2016  . History of cesarean delivery 02/04/2012    Plan: Patient will undergo surgical management of missed abortion with dilation and evacuation. The risks of surgery were discussed in detail with the patient including but not limited to: bleeding which may require transfusion or reoperation; infection which may require antibiotics; injury to surrounding organs which may involve bowel, bladder, ureters ; need for additional procedures including laparoscopy or laparotomy; thromboembolic phenomenon, surgical site problems and other postoperative/anesthesia complications. Likelihood of success in alleviating the patient's condition was discussed. Routine postoperative instructions will be reviewed with the patient and her family in detail after surgery. The patient concurred with the proposed plan, giving informed written consent for the surgery. Patient has been NPO since last night she will remain NPO for procedure. Anesthesia and OR aware. Preoperative prophylactic antibiotics and SCDs ordered on call to the OR. To OR when ready.  Levie Heritage, DO  01/12/2016, 11:22 AM    H&P above is updated today as follows: Patient underwent  suction D&C 01/12/16. She has mild cramping and minimal bleeding. US done 01/14/16 showed retained POC and she was scheduled for D&E  today. The risks of surgery were discussed in detail with the patient including but not limited to: bleeding which may require transfusion or reoperation; infection which may require antibiotics; injury to surrounding organs which may involve bowel, bladder, ureters ; need for additional procedures including laparoscopy or laparotomy; thromboembolic phenomenon, surgical site problems and other postoperative/anesthesia complications. Likelihood of success in alleviating the patient's condition was discussed. Routine postoperative instructions will be reviewed with the patient and her family in detail after surgery. The patient concurred with the proposed plan, giving informed written consent for the surgery. Patient has been NPO since last night she will remain NPO for procedure. Anesthesia and OR aware. Preoperative prophylactic antibiotics and SCDs ordered on call to the OR. To OR when ready. CLINICAL DATA: Retained products of conception following abortion  EXAM: TRANSABDOMINAL ULTRASOUND OF PELVIS  TECHNIQUE: Transabdominal ultrasound examination of the pelvis was performed including evaluation of the uterus, ovaries, adnexal regions, and pelvic cul-de-sac.  COMPARISON: None.  FINDINGS: Uterus  Measurements: 11.3 x 6.8 x 8.5 cm. No fibroids or other mass visualized.  Endometrium  Thickness: Thickened endometrium measuring up to 18 mm. Heterogeneous echogenic material seen within the endometrium.  Right ovary  Measurements: 2.1 x 0.9 x 1.6 cm. Normal appearance/no adnexal mass.  Left ovary  Measurements: 1.9 x 1.1 x 1.4 cm. Normal appearance/no adnexal mass.  Other findings: No abnormal free fluid.  IMPRESSION: Heterogeneous/echogenic material within the thickened endometrium concerning for retained products of conception.   Electronically Signed  By: Charlett Nose M.D.   Adam Phenix, MD 01/15/2016 11:04 AM

## 2016-10-04 ENCOUNTER — Other Ambulatory Visit (HOSPITAL_COMMUNITY): Payer: Self-pay | Admitting: Nurse Practitioner

## 2016-10-04 LAB — OB RESULTS CONSOLE RPR: RPR: NONREACTIVE

## 2016-10-04 LAB — OB RESULTS CONSOLE ANTIBODY SCREEN: ANTIBODY SCREEN: NEGATIVE

## 2016-10-04 LAB — OB RESULTS CONSOLE ABO/RH: RH TYPE: POSITIVE

## 2016-10-04 LAB — OB RESULTS CONSOLE RUBELLA ANTIBODY, IGM: Rubella: IMMUNE

## 2016-10-04 LAB — OB RESULTS CONSOLE HIV ANTIBODY (ROUTINE TESTING): HIV: NONREACTIVE

## 2016-10-04 LAB — OB RESULTS CONSOLE GC/CHLAMYDIA
CHLAMYDIA, DNA PROBE: NEGATIVE
GC PROBE AMP, GENITAL: NEGATIVE

## 2016-10-04 LAB — CYSTIC FIBROSIS GENE TEST: Cystic Fibrosis Profile: NEGATIVE

## 2016-10-04 LAB — OB RESULTS CONSOLE HEPATITIS B SURFACE ANTIGEN: HEP B S AG: NEGATIVE

## 2016-10-13 ENCOUNTER — Ambulatory Visit (HOSPITAL_COMMUNITY)
Admission: RE | Admit: 2016-10-13 | Discharge: 2016-10-13 | Disposition: A | Discharge status: Home / Self Care | Payer: Medicaid Other | Source: Ambulatory Visit | Attending: Nurse Practitioner | Admitting: Nurse Practitioner

## 2016-10-13 DIAGNOSIS — O352XX0 Maternal care for (suspected) hereditary disease in fetus, not applicable or unspecified: Secondary | ICD-10-CM | POA: Insufficient documentation

## 2016-10-13 DIAGNOSIS — Z3A25 25 weeks gestation of pregnancy: Secondary | ICD-10-CM | POA: Insufficient documentation

## 2016-10-13 NOTE — Progress Notes (Signed)
Genetic Counseling  Visit Summary Note  Appointment Date: 10/13/2016 Referred By: Dawn Bloom, NP  Date of Birth: 1980/12/08  Pregnancy history: [id number] Estimated Date of Delivery: 01/19/2017   I met with Dawn [name] for genetic counseling because of a family history of a son with congenital hearing loss.  Spanish interpreter, Dawn [name], provided Spanish/English translation.  In summary:  Discussed family history of congenital hearing loss  Reviewed autosomal recessive inheritance vs. Sporadic vs. inutero exposure  Discussed options of screening / testing postnatally  Expanded carrier screening  Newborn hearing screen  Discussed general population carrier screening options - declined  CF  SMA  Hemoglobinopathies  We began by reviewing the family history in detail. Dawn [name] reported that she and her husband have 3 children together.  Their youngest, a 14 year old son, failed his newborn hearing screen.  He was later found to have an absent cochlear nerve on the right side and a very small cochlear nerve on the left side.  He now has a cochlear implant on the left and is beginning to verbalize and hear.  He also uses sign language to communicate and reads lips.  Dawn [name] does not have concern about his health or development, apart from his language and hearing.  He has been evaluated at [facility], [place] and no other health concerns were identified.  We discussed that hearing loss can result from error in embryogenesis or intrauterine events that affect embryonic and fetal growth.  Additionally, a number of genes have been found to be important for the development of the ear.  Changes in these genes often affect more than one part of the ear or may be associated with anomalies in other organ systems (renal in particular).  As Dawn [name] reports no other health concerns for her son, it is difficult to determine if his deafness is related to a  underlying genetic condition or has another etiology.  We discussed the various patterns of inheritance including autosomal recessive, autosomal dominant and X linked.  We discussed that the chance for another child to have a similar hearing loss could be as high as 25% if this were to be an autosomal recessive condition in the family.  We discussed that expanded carrier screening is able to identify many carriers of autosomal recessive conditions, but not all carriers and not all conditions will be identified.  She declined expanded carrier screening.  If her son's hearing loss was sporadic, due to a perinatal insult, the recurrence chance would be much less, likely less than 1%.  She expressed that she would wait for the newborn hearing screen after delivery.  The family histories were otherwise found to be noncontributory for birth defects, mental retardation, and known genetic conditions. Without further information regarding the provided family history, an accurate genetic risk cannot be calculated. Further genetic counseling is warranted if more information is obtained.  Dawn [name] was provided with written information regarding cystic fibrosis (CF), spinal muscular atrophy (SMA) and hemoglobinopathies including the carrier frequency, availability of carrier screening and prenatal diagnosis if indicated.  In addition, we discussed that CF and hemoglobinopathies are routinely screened for as part of the [name] newborn screening panel.  After further discussion, she declined screening for CF, SMA and hemoglobinopathies.  Dawn [name] denied exposure to environmental toxins or chemical agents. She denied the use of alcohol, tobacco or street drugs. She denied significant viral illnesses during the course of her pregnancy. Her medical and surgical histories were noncontributory.  I counseled Dawn [name], with the assistance of an in person Spanish interpreter, regarding the above risks  and available options.  The approximate face-to-face time with the genetic counselor was 40 minutes.  Dawn Hai, MS Certified Genetic Counselor

## 2016-10-14 ENCOUNTER — Encounter (HOSPITAL_COMMUNITY): Payer: Self-pay

## 2016-11-29 NOTE — L&D Delivery Note (Signed)
Delivery Note At 7:34 PM a viable female was delivered via Vaginal, Spontaneous Delivery with excellent maternal effort. OA to LOT. APGAR: 4, 9; weight. Infant had loose nuchal cord, delivered through. After birth of head,anterior shoulder did not easily deliver. Attempted to free posterior arm, however it would not extend. Patient placed in McRoberts position and anterior shoulder was born easily with gentle downward traction. After birth, Infant with poor tone and minimal respiratory effort. Cord was doubly clamped and cut and infant taken to warmer, NICU was called.   Placenta status: was born spontaneously with maternal effort, membranes teased out with ring forceps.  Cord:3VC  with the following complications: loose nuchal, delivered through. Cord gasses drawn and sent.   Anesthesia: none   Episiotomy: None Lacerations: left periurethral, not repaired   Est. Blood Loss (mL): 200    Mom to postpartum.  Baby to Couplet care / Skin to Skin.  Baird KayKathryn Manchester 01/20/2017, 7:58 PM  The above was performed under my direct supervision and guidance.

## 2016-12-23 LAB — OB RESULTS CONSOLE GC/CHLAMYDIA
CHLAMYDIA, DNA PROBE: NEGATIVE
Gonorrhea: NEGATIVE

## 2016-12-23 LAB — OB RESULTS CONSOLE GBS: STREP GROUP B AG: NEGATIVE

## 2017-01-20 ENCOUNTER — Inpatient Hospital Stay (HOSPITAL_COMMUNITY)
Admission: AD | Admit: 2017-01-20 | Discharge: 2017-01-22 | DRG: 775 | Disposition: A | Discharge status: Home / Self Care | Payer: Medicaid Other | Source: Ambulatory Visit | Attending: Obstetrics and Gynecology | Admitting: Obstetrics and Gynecology

## 2017-01-20 ENCOUNTER — Encounter (HOSPITAL_COMMUNITY): Payer: Self-pay | Admitting: *Deleted

## 2017-01-20 DIAGNOSIS — O1494 Unspecified pre-eclampsia, complicating childbirth: Secondary | ICD-10-CM | POA: Diagnosis present

## 2017-01-20 DIAGNOSIS — O4202 Full-term premature rupture of membranes, onset of labor within 24 hours of rupture: Principal | ICD-10-CM | POA: Diagnosis present

## 2017-01-20 DIAGNOSIS — O169 Unspecified maternal hypertension, unspecified trimester: Secondary | ICD-10-CM | POA: Diagnosis present

## 2017-01-20 DIAGNOSIS — O34211 Maternal care for low transverse scar from previous cesarean delivery: Secondary | ICD-10-CM | POA: Diagnosis present

## 2017-01-20 DIAGNOSIS — Z3A40 40 weeks gestation of pregnancy: Secondary | ICD-10-CM

## 2017-01-20 LAB — COMPREHENSIVE METABOLIC PANEL
ALT: 11 U/L — ABNORMAL LOW (ref 14–54)
ANION GAP: 10 (ref 5–15)
AST: 21 U/L (ref 15–41)
Albumin: 3.2 g/dL — ABNORMAL LOW (ref 3.5–5.0)
Alkaline Phosphatase: 243 U/L — ABNORMAL HIGH (ref 38–126)
BUN: 11 mg/dL (ref 6–20)
CHLORIDE: 108 mmol/L (ref 101–111)
CO2: 19 mmol/L — AB (ref 22–32)
Calcium: 8.8 mg/dL — ABNORMAL LOW (ref 8.9–10.3)
Creatinine, Ser: 0.48 mg/dL (ref 0.44–1.00)
GFR calc non Af Amer: >60 mL/min (ref >60)
Glucose, Bld: 84 mg/dL (ref 65–99)
POTASSIUM: 3.9 mmol/L (ref 3.5–5.1)
SODIUM: 137 mmol/L (ref 135–145)
Total Bilirubin: 0.3 mg/dL (ref 0.3–1.2)
Total Protein: 6.7 g/dL (ref 6.5–8.1)

## 2017-01-20 LAB — CBC
HCT: 33.5 % — ABNORMAL LOW (ref 36.0–46.0)
HEMOGLOBIN: 11.6 g/dL — AB (ref 12.0–15.0)
MCH: 27.9 pg (ref 26.0–34.0)
MCHC: 34.6 g/dL (ref 30.0–36.0)
MCV: 80.5 fL (ref 78.0–100.0)
Platelets: 178 10*3/uL (ref 150–400)
RBC: 4.16 MIL/uL (ref 3.87–5.11)
RDW: 12.9 % (ref 11.5–15.5)
WBC: 9.7 10*3/uL (ref 4.0–10.5)

## 2017-01-20 LAB — CORD BLOOD GAS (ARTERIAL)
Bicarbonate: 22.2 mmol/L (ref 20.0–28.0)
PCO2 CORD BLOOD: 63.3 mmHg — AB (ref 42.0–56.0)
PH CORD BLOOD: 7.172 — AB (ref 7.210–7.380)

## 2017-01-20 LAB — TYPE AND SCREEN
ABO/RH(D): O POS
Antibody Screen: NEGATIVE

## 2017-01-20 LAB — AMNISURE RUPTURE OF MEMBRANE (ROM) NOT AT ARMC: AMNISURE: POSITIVE

## 2017-01-20 LAB — PROTEIN / CREATININE RATIO, URINE
Creatinine, Urine: 71 mg/dL
PROTEIN CREATININE RATIO: 0.54 mg/mg{creat} — AB (ref 0.00–0.15)
Total Protein, Urine: 38 mg/dL

## 2017-01-20 MED ORDER — METHYLERGONOVINE MALEATE 0.2 MG PO TABS: 0.2000 mg | ORAL_TABLET | ORAL | Status: DC | PRN

## 2017-01-20 MED ORDER — OXYCODONE-ACETAMINOPHEN 5-325 MG PO TABS: 2.0000 | ORAL_TABLET | ORAL | Status: DC | PRN

## 2017-01-20 MED ORDER — LACTATED RINGERS IV SOLN
INTRAVENOUS | Status: DC
  Administered 2017-01-20: 19:00:00 via INTRAVENOUS

## 2017-01-20 MED ORDER — OXYTOCIN BOLUS FROM INFUSION
500.0000 mL | Freq: Once | INTRAVENOUS | Status: AC
  Administered 2017-01-20: 500 mL via INTRAVENOUS

## 2017-01-20 MED ORDER — FLEET ENEMA 7-19 GM/118ML RE ENEM: 1.0000 | ENEMA | Freq: Every day | RECTAL | Status: DC | PRN

## 2017-01-20 MED ORDER — ZOLPIDEM TARTRATE 5 MG PO TABS: 5.0000 mg | ORAL_TABLET | Freq: Every evening | ORAL | Status: DC | PRN

## 2017-01-20 MED ORDER — LACTATED RINGERS IV SOLN: 500.0000 mL | INTRAVENOUS | Status: DC | PRN

## 2017-01-20 MED ORDER — ONDANSETRON HCL 4 MG/2ML IJ SOLN: 4.0000 mg | INTRAMUSCULAR | Status: DC | PRN

## 2017-01-20 MED ORDER — ONDANSETRON HCL 4 MG/2ML IJ SOLN: 4.0000 mg | Freq: Four times a day (QID) | INTRAMUSCULAR | Status: DC | PRN

## 2017-01-20 MED ORDER — BENZOCAINE-MENTHOL 20-0.5 % EX AERO: 1.0000 "application " | INHALATION_SPRAY | CUTANEOUS | Status: DC | PRN

## 2017-01-20 MED ORDER — SOD CITRATE-CITRIC ACID 500-334 MG/5ML PO SOLN: 30.0000 mL | ORAL | Status: DC | PRN

## 2017-01-20 MED ORDER — TETANUS-DIPHTH-ACELL PERTUSSIS 5-2.5-18.5 LF-MCG/0.5 IM SUSP: 0.5000 mL | Freq: Once | INTRAMUSCULAR | Status: DC

## 2017-01-20 MED ORDER — FENTANYL CITRATE (PF) 100 MCG/2ML IJ SOLN
50.0000 ug | Freq: Once | INTRAMUSCULAR | Status: AC
  Administered 2017-01-20: 50 ug via INTRAVENOUS
  Filled 2017-01-20: qty 2

## 2017-01-20 MED ORDER — COCONUT OIL OIL: 1.0000 "application " | TOPICAL_OIL | Status: DC | PRN

## 2017-01-20 MED ORDER — FERROUS SULFATE 325 (65 FE) MG PO TABS
325.0000 mg | ORAL_TABLET | Freq: Two times a day (BID) | ORAL | Status: DC
  Administered 2017-01-21 (×2): 325 mg via ORAL
  Filled 2017-01-20 (×2): qty 1

## 2017-01-20 MED ORDER — DIPHENHYDRAMINE HCL 25 MG PO CAPS: 25.0000 mg | ORAL_CAPSULE | Freq: Four times a day (QID) | ORAL | Status: DC | PRN

## 2017-01-20 MED ORDER — OXYCODONE HCL 5 MG PO TABS: 10.0000 mg | ORAL_TABLET | ORAL | Status: DC | PRN

## 2017-01-20 MED ORDER — ACETAMINOPHEN 325 MG PO TABS
650.0000 mg | ORAL_TABLET | ORAL | Status: DC | PRN
Start: 2017-01-20 — End: 2017-01-22
  Administered 2017-01-21: 650 mg via ORAL
  Filled 2017-01-20: qty 2

## 2017-01-20 MED ORDER — ONDANSETRON HCL 4 MG PO TABS: 4.0000 mg | ORAL_TABLET | ORAL | Status: DC | PRN

## 2017-01-20 MED ORDER — OXYCODONE-ACETAMINOPHEN 5-325 MG PO TABS: 1.0000 | ORAL_TABLET | ORAL | Status: DC | PRN

## 2017-01-20 MED ORDER — OXYCODONE HCL 5 MG PO TABS: 5.0000 mg | ORAL_TABLET | ORAL | Status: DC | PRN

## 2017-01-20 MED ORDER — DIBUCAINE 1 % RE OINT: 1.0000 "application " | TOPICAL_OINTMENT | RECTAL | Status: DC | PRN

## 2017-01-20 MED ORDER — IBUPROFEN 600 MG PO TABS
600.0000 mg | ORAL_TABLET | Freq: Four times a day (QID) | ORAL | Status: DC
  Administered 2017-01-20 – 2017-01-22 (×6): 600 mg via ORAL
  Filled 2017-01-20 (×6): qty 1

## 2017-01-20 MED ORDER — OXYTOCIN 40 UNITS IN LACTATED RINGERS INFUSION - SIMPLE MED
2.5000 [IU]/h | INTRAVENOUS | Status: DC
  Filled 2017-01-20: qty 1000

## 2017-01-20 MED ORDER — MEASLES, MUMPS & RUBELLA VAC ~~LOC~~ INJ: 0.5000 mL | INJECTION | Freq: Once | SUBCUTANEOUS | Status: DC

## 2017-01-20 MED ORDER — WITCH HAZEL-GLYCERIN EX PADS: 1.0000 "application " | MEDICATED_PAD | CUTANEOUS | Status: DC | PRN

## 2017-01-20 MED ORDER — SIMETHICONE 80 MG PO CHEW: 80.0000 mg | CHEWABLE_TABLET | ORAL | Status: DC | PRN

## 2017-01-20 MED ORDER — LIDOCAINE HCL (PF) 1 % IJ SOLN
30.0000 mL | INTRAMUSCULAR | Status: DC | PRN
  Filled 2017-01-20: qty 30

## 2017-01-20 MED ORDER — ACETAMINOPHEN 325 MG PO TABS: 650.0000 mg | ORAL_TABLET | ORAL | Status: DC | PRN

## 2017-01-20 MED ORDER — PRENATAL MULTIVITAMIN CH
1.0000 | ORAL_TABLET | Freq: Every day | ORAL | Status: DC
  Administered 2017-01-21: 1 via ORAL
  Filled 2017-01-20: qty 1

## 2017-01-20 MED ORDER — DOCUSATE SODIUM 100 MG PO CAPS
100.0000 mg | ORAL_CAPSULE | Freq: Two times a day (BID) | ORAL | Status: DC
  Administered 2017-01-21 – 2017-01-22 (×3): 100 mg via ORAL
  Filled 2017-01-20 (×3): qty 1

## 2017-01-20 MED ORDER — METHYLERGONOVINE MALEATE 0.2 MG/ML IJ SOLN: 0.2000 mg | INTRAMUSCULAR | Status: DC | PRN

## 2017-01-20 MED ORDER — BISACODYL 10 MG RE SUPP: 10.0000 mg | Freq: Every day | RECTAL | Status: DC | PRN

## 2017-01-20 NOTE — H&P (Signed)
Dawn [name] is a 36 y.o. female presenting for rupture of membranes.  [facility]  Hx complicated by csection with last infant and two term vaginal deliveries prior to that.  Due to elevated blood pressure during triage, inquired about preeclampsia symptoms.  Pt denies  headache, vision changes, or epigastric pain.   +clear vaginal discharge reported early in the am.  OB History    Gravida Para Term Preterm AB Living   5 3 3   1 3    SAB TAB Ectopic Multiple Live Births   1       1     Past Medical History:  Diagnosis Date  . Language barrier    Hospital Interpreter Eda Royal assisted with pat history  . No pertinent past medical history    Past Surgical History:  Procedure Laterality Date  . CESAREAN SECTION  02/01/2012   Procedure: CESAREAN SECTION;  Surgeon: Scheryl DarterJames Arnold, MD;  Location: [facility];  Service: Gynecology;  Laterality: N/A;  ultrasound prior c/s  . DILATION AND EVACUATION N/A 01/12/2016   Procedure: DILATATION AND EVACUATION;  Surgeon: Levie HeritageJacob J Stinson, DO;  Location: [facility];  Service: Gynecology;  Laterality: N/A;  missed AB 12-14 weeks   . DILATION AND EVACUATION N/A 01/15/2016   Procedure: DILATATION AND EVACUATION;  Surgeon: Adam PhenixJames G Arnold, MD;  Location: [facility];  Service: Gynecology;  Laterality: N/A;  . NO PAST SURGERIES    . VAGINAL DELIVERY  x2   Family History: family history is not on file. Social History:  reports that she has never smoked. She has never used smokeless tobacco. She reports that she does not drink alcohol or use drugs.     Maternal Diabetes: No Genetic Screening: Normal Maternal Ultrasounds/Referrals: Normal Fetal Ultrasounds or other Referrals:  None Maternal Substance Abuse:  No Significant Maternal Medications:  None Significant Maternal Lab Results:  Lab values include: Other: GBS neg Other Comments:  UPC ratio 0.5  Review of Systems  Constitutional: Negative.   HENT: Negative.   Eyes: Negative.    Respiratory: Negative.   Cardiovascular: Negative.   Gastrointestinal: Positive for abdominal pain.  Genitourinary: Negative.   Musculoskeletal: Negative.   Skin: Negative.   Neurological: Negative.   Endo/Heme/Allergies: Negative.   Psychiatric/Behavioral: Negative.    Maternal Medical History:  Reason for admission: Rupture of membranes and contractions.   Contractions: Onset was 3-5 hours ago.   Frequency: irregular.   Perceived severity is mild.    Fetal activity: Perceived fetal activity is normal.   Last perceived fetal movement was within the past hour.    Prenatal complications: Pre-eclampsia.   Prenatal Complications - Diabetes: none.    Dilation: 4 Effacement (%): 30 Station: -2 Exam by:: [employer]atherine Manchester SNM Blood pressure 134/84, pulse 72, temperature 98.8 F (37.1 C), temperature source Oral, resp. rate 18, weight 61.2 kg (135 lb), unknown if currently breastfeeding. Maternal Exam:  Uterine Assessment: Contraction strength is mild.  Contraction frequency is regular.   Abdomen: Patient reports no abdominal tenderness. Surgical scars: low transverse.   Fetal presentation: vertex  Introitus: Normal vulva. Normal vagina.  Ferning test: negative.  Nitrazine test: not done. Amniotic fluid character: clear.  Pelvis: adequate for delivery.   Cervix: Cervix evaluated by sterile speculum exam and digital exam.     Physical Exam  Nursing note and vitals reviewed. Constitutional: She is oriented to person, place, and time. She appears well-developed and well-nourished.  HENT:  Head: Normocephalic and atraumatic.  Eyes: Pupils are equal, round, and reactive to light.  Neck: Normal range of motion.  Cardiovascular: Normal rate and regular rhythm.   Respiratory: Effort normal and breath sounds normal.  GI: Soft.  Genitourinary: Vagina normal and uterus normal.  Musculoskeletal: Normal range of motion.  Neurological: She is alert and oriented to person,  place, and time. She has normal reflexes.  Skin: Skin is warm and dry.  Psychiatric: She has a normal mood and affect. Her behavior is normal. Judgment and thought content normal.    Prenatal labs: ABO, Rh: O/Positive/-- (11/06 0000) Antibody: Negative (11/06 0000) Rubella: Immune (11/06 0000) RPR: Nonreactive (11/06 0000)  HBsAg: Negative (11/06 0000)  HIV: Non-reactive (11/06 0000)  GBS: Negative (01/25 0000)   Assessment/Plan: Term singleton IUP ROM clear fluid at 0630 Normal early labor  History of c-section  Plan: admit for TOLAC    Baird Kay 01/20/2017, 11:40 AM  I was present and physically examined patient and confirmed all information provided by patient in presence of interpreter [facility] Kennith Gain, CNM

## 2017-01-20 NOTE — Progress Notes (Signed)
Dawn [name] is a 36 y.o. [id number] at 589w1d admitted for rupture of membranes, evaluated with Dawn [name] Dawn [name] and live spanish interpreter.  Subjective: Patient reports contractions are now stronger, she rates them 8/10. Denies any headache, dizzyness, blurry vision, chest pain, shortness of breath, right upper quandrant pain.   Objective: BP 134/84   Pulse 72   Temp 98.4 F (36.9 C) (Oral)   Resp 18   Wt 61.2 kg (135 lb)   BMI 25.51 kg/m  No intake/output data recorded. No intake/output data recorded.  FHT:  FHR: 140 bpm, variability: moderate,  accelerations:  Present,  decelerations:  Absent UC:   regular, every 2-4 minutes, lasting 80-90 seconds, moderate SVE:   Dilation: 6 Effacement (%): 80 Station: -3 Exam by:: Dawn [name], Dawn [name]  Labs: Lab Results  Component Value Date   WBC 9.7 01/20/2017   HGB 11.6 (L) 01/20/2017   HCT 33.5 (L) 01/20/2017   MCV 80.5 01/20/2017   PLT 178 01/20/2017    Assessment / Plan: Spontaneous labor, progressing normally SROM clear fluid Preeclampsia, UP:C 0.5  Labor: Progressing normally Preeclampsia:  no signs or symptoms of toxicity and labs stable Fetal Wellbeing:  Category I Pain Control:  Labor support without medications I/D:  n/a Anticipated MOD:  NSVD  Baird KayKathryn Ethylene Reznick 01/20/2017, 2:38 PM

## 2017-01-20 NOTE — MAU Note (Signed)
Slight bleeding, some white d/c and having some contractions (maybe every 30 min).  Started at [phone].  Noted ? Whitish water when got up to go to the bathroom this morning. Denies BP issues.  Denies HA, epigastric pain, visual problems.  Slight increase in swelling in feet

## 2017-01-20 NOTE — Anesthesia Pain Management Evaluation Note (Signed)
  CRNA Pain Management Visit Note  Patient: Dawn [name], 36 y.o., female  "Hello I am a [facility] of the anesthesia [facility]. We have an anesthesia team available at all times to provide care throughout the hospital, including epidural management and anesthesia for C-section. I don't know your plan for the delivery whether it a natural birth, water birth, IV sedation, nitrous supplementation, doula or epidural, but we want to meet your pain goals."   1.Was your pain managed to your expectations on prior hospitalizations?   Yes   2.What is your expectation for pain management during this hospitalization?     Labor support without medications  3.How can we help you reach that goal? unsure  Record the patient's initial score and the patient's pain goal.   Pain: 6  Pain Goal: 10 [facility] wants you to be able to say your pain was always managed very well.  Cephus ShellingBURGER,Haywood Meinders 01/20/2017

## 2017-01-20 NOTE — Progress Notes (Signed)
Dawn [name] is a 36 y.o. [id number] at 8104w1d admitted for rupture of membranes  Subjective: Patient is feeling more pain/pressure  Objective: BP 134/84   Pulse 72   Temp 98.1 F (36.7 C) (Oral)   Resp 20   Ht 5' (1.524 m)   Wt 61.2 kg (135 lb)   BMI 26.37 kg/m  No intake/output data recorded. No intake/output data recorded.  FHT:  FHR: 130 bpm, variability: moderate,  accelerations:  Present,  decelerations:  Absent UC:   regular, every 2-3 minutes SVE:  9.5/100/-1 Labs: Lab Results  Component Value Date   WBC 9.7 01/20/2017   HGB 11.6 (L) 01/20/2017   HCT 33.5 (L) 01/20/2017   MCV 80.5 01/20/2017   PLT 178 01/20/2017    Assessment / Plan: Spontaneous labor, progressing normally AROM at 1810 clear fluids.  Anticipate NSVB   Dawn [name] 01/20/2017, 6:15 PM

## 2017-01-20 NOTE — MAU Note (Signed)
Urine sent to lab 

## 2017-01-21 LAB — RPR: RPR: NONREACTIVE

## 2017-01-21 NOTE — Plan of Care (Signed)
Problem: Activity: Goal: Ability to tolerate increased activity will improve Outcome: Completed/Met Date Met: 01/21/17 Pt ambulating independently in the room without difficulty.  Pt encouraged to walk in the hallways.   Problem: Nutritional: Goal: Mothers verbalization of comfort with breastfeeding process will improve Outcome: Progressing Pt verbalizes comfort with breastfeeding. Pt reports breastfeeding other children for a year.  Pt denies sore nipples.  Latch seen and pt able to latch baby with minimal difficulty and able to unlatch baby if baby latches poorly.  Baby's lips flanged and occasional swallows heard.

## 2017-01-21 NOTE — Progress Notes (Signed)
Post Partum Day 1 Subjective: no complaints, up ad lib, voiding and tolerating PO  Objective: Blood pressure 121/81, pulse 72, temperature 98.1 F (36.7 C), temperature source Oral, resp. rate 20, height 5' (1.524 m), weight 61.2 kg (135 lb), SpO2 100 %, unknown if currently breastfeeding.  Physical Exam:  General: alert Lochia: appropriate Uterine Fundus: firm and NT at U-1 DVT Evaluation: No evidence of DVT seen on physical exam.   Recent Labs  01/20/17 1012  HGB 11.6*  HCT 33.5*    Assessment/Plan: Plan for discharge tomorrow   LOS: 1 day   Marelly Wehrman C Analea Muller 01/21/2017, 6:19 AM

## 2017-01-21 NOTE — Lactation Note (Signed)
This note was copied from a baby's chart. Lactation Consultation Note Spanish speaking mom, used Office managernternational Language line for Spanish.  Experienced BF mom has 3 other children that she BF for 1 1/2 yrs each. Denies any difficulty or infections.  Mom states this baby BF w/o difficulty. Mom is breast/formula feeding. Mom is supplementing after BF. Mom and RN states baby is latching well getting nipple into mouth. LC concerned that nipple to large for adequate transfer. Mom had already formula baby, so can't see a latch at this time.  Mom has large door knob nipple to Lt. Nipple, large nipple Rt. Breast compressed for hand expression, colostrum poured out of breast.  Mom encouraged to feed baby 8-12 times/24 hours and with feeding cues. Mom encouraged to waken baby for feeds.  Educated newborn behavior and feeding habits. Stressed importance of I&O, STS. [facility] brochure given w/resources, support groups and LC services. Patient Name: Dawn [name]: 01/21/2017 Reason for consult: Initial assessment   Maternal Data Has patient been taught Hand Expression?: Yes Does the patient have breastfeeding experience prior to this delivery?: Yes  Feeding Feeding Type: Formula Nipple Type: Slow - flow Length of feed: 30 min  LATCH Score/Interventions Latch: Grasps breast easily, tongue down, lips flanged, rhythmical sucking. Intervention(s): Assist with latch;Adjust position;Breast massage  Audible Swallowing: A few with stimulation Intervention(s): Skin to skin;Hand expression  Type of Nipple: Everted at rest and after stimulation  Comfort (Breast/Nipple): Soft / non-tender     Hold (Positioning): Assistance needed to correctly position infant at breast and maintain latch. Intervention(s): Breastfeeding basics reviewed;Support Pillows;Position options;Skin to skin  LATCH Score: 8  Lactation Tools Discussed/Used WIC Program: Yes   Consult Status Consult Status:  Follow-up [name]: 01/22/17 Follow-up type: In-patient    Charyl DancerCARVER, Marybell Robards G 01/21/2017, 6:14 AM

## 2017-01-22 ENCOUNTER — Encounter (HOSPITAL_COMMUNITY): Payer: Self-pay | Admitting: Advanced Practice Midwife

## 2017-01-22 MED ORDER — DOCUSATE SODIUM 100 MG PO CAPS: 100.0000 mg | ORAL_CAPSULE | Freq: Two times a day (BID) | ORAL | 0 refills | Status: DC

## 2017-01-22 NOTE — Progress Notes (Signed)
DC teaching done via interpreter. All questions answered. Refer to booklet.

## 2017-01-22 NOTE — Discharge Summary (Signed)
**Note Dawn-Identified via Obfuscation** OB Discharge Summary     Patient Name: Dawn [name] DOB: May 11, 1981 MRN: [id number]  Date of admission: 01/20/2017 Delivering MD: Myles Lipps   Date of discharge: 01/22/2017  Admitting diagnosis: 40WKS,CTX Intrauterine pregnancy: [redacted]w[redacted]d     Secondary diagnosis:  Active Problems:   Hypertension in pregnancy  Additional problems: Pre-Eclampsia      Discharge diagnosis: Term Pregnancy Delivered and VBAC                                                                                                Post partum procedures:none  Augmentation: AROM  Complications: None  Hospital course:  Onset of Labor With Vaginal Delivery     36 y.o. yo [id number] at [redacted]w[redacted]d was admitted in Latent Labor on 01/20/2017 with SROM. Patient had an uncomplicated labor course as follows:  Membrane Rupture Time/Date: 6:10 PM ,01/20/2017   Intrapartum Procedures: Episiotomy: None [1]                                         Lacerations:  1st degree [2];Periurethral [8]  Patient had a delivery of a Viable infant. 01/20/2017  Information for the patient's newborn:  Dawn [name] [[id number]]  Delivery Method: Vaginal, Spontaneous Delivery (Filed from Delivery Summary)    Pateint had an uncomplicated postpartum course.  She is ambulating, tolerating a regular diet, passing flatus, and urinating well. Patient is discharged home in stable condition on 01/22/17.   Physical exam  Vitals:   01/21/17 0555 01/21/17 0842 01/21/17 1848 01/22/17 0527  BP: 121/81 111/84 125/86 110/78  Pulse: 72 79 91 88  Resp: 20 18 18 18   Temp: 98.1 F (36.7 C) 98.2 F (36.8 C) 98.2 F (36.8 C) 98.5 F (36.9 C)  TempSrc: Oral Oral Oral Oral  SpO2:  99%    Weight:      Height:       General: alert and cooperative Lochia: appropriate Uterine Fundus: firm Incision: N/A DVT Evaluation: No evidence of DVT seen on physical exam. Labs: Lab Results  Component Value Date   WBC 9.7 01/20/2017   HGB 11.6  (L) 01/20/2017   HCT 33.5 (L) 01/20/2017   MCV 80.5 01/20/2017   PLT 178 01/20/2017   CMP Latest Ref Rng & Units 01/20/2017  Glucose 65 - 99 mg/dL 84  BUN 6 - 20 mg/dL 11  Creatinine 1.30 - 8.65 mg/dL 7.84  Sodium 696 - 295 mmol/L 137  Potassium 3.5 - 5.1 mmol/L 3.9  Chloride 101 - 111 mmol/L 108  CO2 22 - 32 mmol/L 19(L)  Calcium 8.9 - 10.3 mg/dL 2.8(U)  Total Protein 6.5 - 8.1 g/dL 6.7  Total Bilirubin 0.3 - 1.2 mg/dL 0.3  Alkaline Phos 38 - 126 U/L 243(H)  AST 15 - 41 U/L 21  ALT 14 - 54 U/L 11(L)    Discharge instruction: per After Visit Summary and "Baby and Me Booklet".  After visit meds:  Allergies as of 01/22/2017   No Known Allergies  Medication List    STOP taking these medications   oxyCODONE-acetaminophen 5-325 MG tablet Commonly known as:  ROXICET     TAKE these medications   docusate sodium 100 MG capsule Commonly known as:  COLACE Take 1 capsule (100 mg total) by mouth 2 (two) times daily.   prenatal multivitamin Tabs tablet Take 1 tablet by mouth daily.       Diet: routine diet  Activity: Advance as tolerated. Pelvic rest for 6 weeks.   Outpatient follow up:6 weeks Follow up Appt:No future appointments. Follow up Visit:No Follow-up on file.  Postpartum contraception: IUD Mirena  Newborn Data: Live born female  Birth Weight: 7 lb 10.6 oz (3475 g) APGAR: 4, 9  Baby Feeding: Breast Disposition:home with mother   01/22/2017 Dawn Hollingsheadatherine L Wallace, DO   CNM attestation I have seen and examined this patient and agree with above documentation in the resident's note.   Dawn [name] is a 36 y.o. [id number] s/p VBAC.   Pain is well controlled.  Plan for birth control is IUD.  Method of Feeding: breast  PE:  BP 110/78 (BP Location: Right Arm)   Pulse 88   Temp 98.5 F (36.9 C) (Oral)   Resp 18   Ht 5' (1.524 m)   Wt 61.2 kg (135 lb)   SpO2 99%   Breastfeeding? Unknown   BMI 26.37 kg/m  Fundus firm   Recent Labs   01/20/17 1012  HGB 11.6*  HCT 33.5*     Plan: discharge today - postpartum care discussed - f/u clinic in 4-6 weeks for postpartum visit   Dawn [name], Dawn [name], CNM 8:54 AM 01/22/2017

## 2017-01-22 NOTE — Lactation Note (Signed)
This note was copied from a baby's chart. Lactation Consultation Note  Baby 538 hours old.  Spanish interpreter present. Mother denies problems or questions.  States breastfeeding is going well. Reviewed engorgement care and monitoring voids/stools. Recommend mother breastfeed before offering formula to help establish her milk supply. Offered mother manual pump.  Mother declined.    Patient Name: Dawn [name] FAOZH'YToday's Date: 01/22/2017 Reason for consult: Follow-up assessment   Maternal Data    Feeding Feeding Type: Breast Fed Length of feed: 30 min  LATCH Score/Interventions Latch:  (Mom didnt call to observe latch)                    Lactation Tools Discussed/Used     Consult Status Consult Status: Complete    Hardie PulleyBerkelhammer, Mickie Badders Boschen 01/22/2017, 9:50 AM

## 2017-01-22 NOTE — Discharge Instructions (Signed)

## 2018-11-29 NOTE — L&D Delivery Note (Signed)
OB/GYN Faculty Practice Delivery Note  Dawn [name] is a 38 y.o. [id number] s/p VBAC at [redacted]w[redacted]d. She was admitted for spontaneous labor.   ROM: 0h 22m with clear fluid GBS Status: unknown   Maximum Maternal Temperature: 98.1 F    Labor Progress: . Patient arrived at 3 cm dilation and subsequently progressed to 8cm by the time she arrived to the floor. During consent for VBAC patient stated she no longer wanted to [facility] and preparations were made for the OR. During that time patient progressed to fully dilated and had SROM. She subsequently delivered quickly thereafter.   Delivery Date/Time: 09/19/2019 at 0720 Delivery: Called to room and patient was complete and pushing. Head delivered in OA position. Tight nuchal, delivered through via somersault. Infant with spontaneous cry, placed on mother's abdomen, dried and stimulated. Cord clamped x 2 after 1-minute delay, and cut by mother. Cord blood drawn. Placenta delivered spontaneously with gentle cord traction. Fundus firm with massage and Pitocin. Labia, perineum, vagina, and cervix inspected with supra-uretheral laceration.   Placenta: 3v, intact, to L&D Complications: none Lacerations: Supra-uretheral, repaired with single interrupted stitch of 4-0 monocryl EBL: 200 cc Analgesia: none   Infant: APGAR (1 MIN): 8   APGAR (5 MINS): 9    Weight: 3070 grams  Augustin Coupe, MD/MPH OB/GYN Fellow, Faculty Practice

## 2019-07-02 LAB — OB RESULTS CONSOLE RPR: RPR: NONREACTIVE

## 2019-07-02 LAB — OB RESULTS CONSOLE HGB/HCT, BLOOD
HCT: 32 (ref 29–41)
Hemoglobin: 10.1

## 2019-07-02 LAB — OB RESULTS CONSOLE HEPATITIS B SURFACE ANTIGEN: Hepatitis B Surface Ag: NEGATIVE

## 2019-07-02 LAB — OB RESULTS CONSOLE HIV ANTIBODY (ROUTINE TESTING): HIV: NONREACTIVE

## 2019-07-02 LAB — OB RESULTS CONSOLE RUBELLA ANTIBODY, IGM: Rubella: IMMUNE

## 2019-07-02 LAB — OB RESULTS CONSOLE PLATELET COUNT: Platelets: 233

## 2019-07-02 LAB — GLUCOSE, 1 HOUR
Glucose 1 Hour: 93
Urine Culture, OB: NEGATIVE

## 2019-07-02 LAB — OB RESULTS CONSOLE GC/CHLAMYDIA
Chlamydia: NEGATIVE
Gonorrhea: NEGATIVE

## 2019-07-02 LAB — OB RESULTS CONSOLE ANTIBODY SCREEN: Antibody Screen: NEGATIVE

## 2019-07-02 LAB — OB RESULTS CONSOLE ABO/RH: RH Type: POSITIVE

## 2019-07-04 ENCOUNTER — Other Ambulatory Visit (HOSPITAL_COMMUNITY): Payer: Self-pay | Admitting: Nurse Practitioner

## 2019-07-04 DIAGNOSIS — O09523 Supervision of elderly multigravida, third trimester: Secondary | ICD-10-CM

## 2019-07-04 DIAGNOSIS — Z3A28 28 weeks gestation of pregnancy: Secondary | ICD-10-CM

## 2019-07-04 DIAGNOSIS — Z363 Encounter for antenatal screening for malformations: Secondary | ICD-10-CM

## 2019-07-05 ENCOUNTER — Other Ambulatory Visit (HOSPITAL_COMMUNITY): Payer: Self-pay | Admitting: *Deleted

## 2019-07-05 ENCOUNTER — Ambulatory Visit (HOSPITAL_COMMUNITY)
Admission: RE | Admit: 2019-07-05 | Discharge: 2019-07-05 | Disposition: A | Discharge status: Home / Self Care | Payer: Self-pay | Source: Ambulatory Visit | Attending: Nurse Practitioner | Admitting: Nurse Practitioner

## 2019-07-05 ENCOUNTER — Ambulatory Visit (HOSPITAL_BASED_OUTPATIENT_CLINIC_OR_DEPARTMENT_OTHER): Payer: Self-pay | Admitting: Genetic Counselor

## 2019-07-05 ENCOUNTER — Other Ambulatory Visit (HOSPITAL_COMMUNITY): Payer: Self-pay | Admitting: Nurse Practitioner

## 2019-07-05 ENCOUNTER — Ambulatory Visit (HOSPITAL_COMMUNITY): Payer: Self-pay | Admitting: Obstetrics and Gynecology

## 2019-07-05 ENCOUNTER — Other Ambulatory Visit: Payer: Self-pay

## 2019-07-05 ENCOUNTER — Ambulatory Visit (HOSPITAL_COMMUNITY): Payer: Self-pay | Admitting: *Deleted

## 2019-07-05 ENCOUNTER — Encounter (HOSPITAL_COMMUNITY): Payer: Self-pay | Admitting: *Deleted

## 2019-07-05 VITALS — BP 121/82 | HR 77 | Temp 98.5°F

## 2019-07-05 DIAGNOSIS — Z822 Family history of deafness and hearing loss: Secondary | ICD-10-CM

## 2019-07-05 DIAGNOSIS — O365990 Maternal care for other known or suspected poor fetal growth, unspecified trimester, not applicable or unspecified: Secondary | ICD-10-CM

## 2019-07-05 DIAGNOSIS — O09523 Supervision of elderly multigravida, third trimester: Secondary | ICD-10-CM

## 2019-07-05 DIAGNOSIS — O352XX0 Maternal care for (suspected) hereditary disease in fetus, not applicable or unspecified: Secondary | ICD-10-CM | POA: Insufficient documentation

## 2019-07-05 DIAGNOSIS — O09529 Supervision of elderly multigravida, unspecified trimester: Secondary | ICD-10-CM | POA: Insufficient documentation

## 2019-07-05 DIAGNOSIS — O365930 Maternal care for other known or suspected poor fetal growth, third trimester, not applicable or unspecified: Secondary | ICD-10-CM

## 2019-07-05 DIAGNOSIS — O34219 Maternal care for unspecified type scar from previous cesarean delivery: Secondary | ICD-10-CM

## 2019-07-05 DIAGNOSIS — Z363 Encounter for antenatal screening for malformations: Secondary | ICD-10-CM | POA: Insufficient documentation

## 2019-07-05 DIAGNOSIS — Z3A28 28 weeks gestation of pregnancy: Secondary | ICD-10-CM

## 2019-07-05 NOTE — Progress Notes (Signed)
07/05/2019  Dawn [name] 07/06/1981 MRN: [id number] DOV: 07/05/2019  Dawn [name] presented [facility] for a genetics consultation regarding advanced maternal age and history of previous child with hearing loss. Dawn [name] came to her appointment alone due to COVID-19 visitor restrictions. The session was facilitated by a [facility] Spanish interpreter.  Indication for genetic counseling - AMA - Previous child with congenital hearing loss  Prenatal history  Dawn [name] is a [id number], 38 y.o. year old female. Her current pregnancy has completed 7359w4d (Estimated Date of Delivery: 09/23/19).  Dawn [name] denied exposure to environmental toxins or chemical agents. She denied the use of alcohol, tobacco or street drugs. She denied significant viral illnesses, fevers, and bleeding during the course of her pregnancy. Her medical and surgical histories were noncontributory.  Family History  A three generation pedigree was drafted and reviewed. The family history is remarkable for the following:  - Dawn [name] has a 977 year old son with congenital hearing loss. Per past notes, he was born with an absent right cochlear nerve and a very small left cochlear nerve. [facility] by genetics [facility]. While it is unclear from records if genetic testing was ever performed, Dawn [name] indicated that a genetic cause for his hearing loss has not been identified. She reports that he has no other health or developmental concerns. See Discussion section for additional information.  The remaining family histories were reviewed and found to be noncontributory for birth defects, intellectual disability, recurrent pregnancy loss, and known genetic conditions.    The patient's ethnicity is [place]. The father of the pregnancy's ethnicity is [place]. Ashkenazi Jewish ancestry and consanguinity were denied. Pedigree will be scanned under  Media.  Discussion  Dawn [name] was referred to genetic counseling for advanced maternal age, as she will be 38 years old at the time of delivery. We reviewed that at her age and during the second trimester, there is approximately a 1 in 111 (~1%) chance of having a child with a chromosomal abnormality. Her age-related risk to have a child with Down syndrome specifically is 1 in 237 (0.42%). We briefly discussed features of trisomy 6821 (Down syndrome), trisomy 4613, and trisomy 4218. These conditions often are not inherited, but instead occur to an error in chromosomal division during the formation of sperm and egg cells.  We reviewed noninvasive prenatal screening (NIPS) as an available screening option. Specifically, we discussed that NIPS analyzes cell free fetal DNA found in the maternal blood circulation during pregnancy. This test is not diagnostic for chromosome conditions, but can provide information regarding the presence or absence of extra fetal DNA for chromosomes 13, 18 and 21. Thus, it would not identify or rule out all fetal aneuploidy. The reported detection rate is greater than 99% for trisomy 21, greater than 98% for trisomy 18, and greater than 99% for trisomy 2813. The false positive rate is reported to be less than 0.1% for any of these conditions. Dawn [name] was interested in pursuing NIPS; however, she does not have insurance and wanted to discuss testing with her partner. I told her that I would investigate laboratories that may be able to offer low-cost or free NIPS. I will call her next week to present her with her options.  We also reviewed the genetics of congenital hearing loss. We discussed that there are several possible explanations for her son's hearing loss, including genetic conditions or errors in embryonal/fetal development. Given that  her son has no other health concerns, it is difficult to determine if his deafness is related to a underlying genetic condition or  has another etiology. However, literature indicates that up to 80% of congenital hearing loss is caused by genetic factors. Of that 80%, 20% is syndromic and 80% is nonsyndromic. We briefly reviewed that there are various possible patterns of inheritance associated with hearing loss, including autosomal recessive inheritance. Dawn [name] was counseled that the chance to having another child with hearing loss could be up to 1 in 4 (25%) if there is an autosomal recessive condition in the family.   We discussed that expanded carrier screening (ECS) is a testing option that evaluates for many recessive causes of congenital hearing loss. If she were found to be a carrier for a condition, we would then want to evaluate her partner to determine if he carries the same condition. This could help determine whether her pregnancy is at risk for hearing loss. We also reviewed that all babies born in the state of [place] are screened for hearing loss at birth. Thus, she has the option to wait until birth to determine if genetic testing for hearing loss is something she would be interested in pursuing. Dawn [name] declined ECS, indicating that she would rather wait for newborn screening to determine if her child will have hearing loss, then possibly pursue a genetics workup from there.  Finally, Dawn [name] was counseled regarding diagnostic testing via amniocentesis. We discussed the technical aspects of the procedure and quoted up to a 1 in 500 (0.2%) risk for spontaneous pregnancy loss or other adverse pregnancy outcomes as a result of amniocentesis. Cultured cells from an amniocentesis sample allow for the visualization of a fetal karyotype, which can detect >99% of chromosomal aberrations. Chromosomal microarray can also be performed to identify smaller deletions or duplications of fetal chromosomal material. Additionally, if Dawn [name] and her partner were found to be carriers of an  inherited form of hearing loss, genetic testing for the cause of hearing loss could also be investigated on the amniocentesis sample to determine if the baby would be affected.  A complete ultrasound was performed today prior to our visit. The ultrasound report will be sent under separate cover. There were no visualized fetal anomalies or markers suggestive of aneuploidy. However, the fetus did appear to be small, with estimated fetal weight at the 4th percentile.   Additional screening and diagnostic testing were declined today, until the option of low-cost NIPS can be explored. She understands that screening tests, including ultrasound, cannot rule out all birth defects or genetic syndromes. The patient was advised of this limitation and states she still does not want additional testing or screening today. I will call her next week with NIPS options. If she decides to undergo NIPS, I will coordinate sample collection and the testing process.  I counseled Dawn [name] regarding the above risks and available options. The approximate face-to-face time with the genetic counselor was 40 minutes.  In summary:  Discussed age-related risks for chromosomal conditions and options for follow-up testing  Potentially interested in undergoing NIPS if a low-cost option is available  Discussed expanded carrier screening (ECS) to attempt to identify inherited cause of hearing loss in family  Offered additional testing and screening  Declined ECS & amniocentesis  Reviewed family history concerns   Buelah Manis, MS Genetic Counselor

## 2019-07-06 ENCOUNTER — Encounter (HOSPITAL_COMMUNITY): Payer: Self-pay

## 2019-07-13 ENCOUNTER — Telehealth (HOSPITAL_COMMUNITY): Payer: Self-pay | Admitting: Genetic Counselor

## 2019-07-13 NOTE — Telephone Encounter (Signed)
Attempted to call Dawn [name] at two different numbers with [employer] #[phone] to discuss low-cost NIPS, but did not receive an answer at either number. Dawn [name] is eligible to get free NIPS through [employer]'s Patient Assistance Program. I can provide her with a blood draw kit at her next appointment on 8/20 and coordinate the blood draw if she is still interested in pursuing this testing. I will attempt to call Dawn [name] again next week, but if I am not able to successfully reach her then I will aim to quickly meet with her either before or after her ultrasound on 8/20.  Haley E Hill, MS Genetic Counselor 

## 2019-07-18 ENCOUNTER — Encounter: Payer: Self-pay | Admitting: *Deleted

## 2019-07-18 ENCOUNTER — Telehealth (HOSPITAL_COMMUNITY): Payer: Self-pay | Admitting: Genetic Counselor

## 2019-07-18 DIAGNOSIS — Z789 Other specified health status: Secondary | ICD-10-CM

## 2019-07-18 DIAGNOSIS — O0930 Supervision of pregnancy with insufficient antenatal care, unspecified trimester: Secondary | ICD-10-CM | POA: Insufficient documentation

## 2019-07-18 NOTE — Telephone Encounter (Signed)
LVM for Dawn [name] with the help of Spanish interpreter [phone]. Dawn [name] was informed that she is eligible for free noninvasive prenatal screening (NIPS) for the pregnancy. She was instructed to bring tax forms to her appointment tomorrow if she is still interested in pursuing free testing. I left my direct contact information should she have any questions.  Dawn [name] is eligible to get free NIPS through [employer]'s Patient Assistance Program. I can provide her with a blood draw kit at her appointment and coordinate the blood draw if she is still interested in pursuing this testing. However, tax forms are necessary for the application as proof of income. I will begin filling out the Patient Assistance Program form and leave a note in her chart for the nurses and sonographers, requesting that they help her complete the Patient Assistance Program application and find me for blood draw coordination.  Buelah Manis, MS Genetic Counselor

## 2019-07-18 NOTE — Progress Notes (Signed)
Opened in error

## 2019-07-19 ENCOUNTER — Encounter: Payer: Self-pay | Admitting: *Deleted

## 2019-07-19 ENCOUNTER — Encounter (HOSPITAL_COMMUNITY): Payer: Self-pay

## 2019-07-19 ENCOUNTER — Ambulatory Visit (HOSPITAL_COMMUNITY): Payer: Self-pay | Admitting: *Deleted

## 2019-07-19 ENCOUNTER — Ambulatory Visit (HOSPITAL_COMMUNITY)
Admission: RE | Admit: 2019-07-19 | Discharge: 2019-07-19 | Disposition: A | Discharge status: Home / Self Care | Payer: Self-pay | Source: Ambulatory Visit | Attending: Obstetrics and Gynecology | Admitting: Obstetrics and Gynecology

## 2019-07-19 ENCOUNTER — Other Ambulatory Visit: Payer: Self-pay

## 2019-07-19 VITALS — BP 112/64 | HR 79 | Temp 98.5°F

## 2019-07-19 DIAGNOSIS — O09523 Supervision of elderly multigravida, third trimester: Secondary | ICD-10-CM

## 2019-07-19 DIAGNOSIS — Z789 Other specified health status: Secondary | ICD-10-CM

## 2019-07-19 DIAGNOSIS — O365930 Maternal care for other known or suspected poor fetal growth, third trimester, not applicable or unspecified: Secondary | ICD-10-CM | POA: Insufficient documentation

## 2019-07-19 DIAGNOSIS — O0930 Supervision of pregnancy with insufficient antenatal care, unspecified trimester: Secondary | ICD-10-CM | POA: Insufficient documentation

## 2019-07-19 DIAGNOSIS — Z3A30 30 weeks gestation of pregnancy: Secondary | ICD-10-CM

## 2019-07-19 DIAGNOSIS — O365990 Maternal care for other known or suspected poor fetal growth, unspecified trimester, not applicable or unspecified: Secondary | ICD-10-CM | POA: Insufficient documentation

## 2019-07-19 DIAGNOSIS — O34219 Maternal care for unspecified type scar from previous cesarean delivery: Secondary | ICD-10-CM

## 2019-07-24 ENCOUNTER — Telehealth: Payer: Self-pay | Admitting: Family Medicine

## 2019-07-24 NOTE — Telephone Encounter (Signed)
Spanish interpreter Eda spoke with patient about her appointment on 8/26 @ 9:55. Patient instructed to wear a face mask and no visitors are allowed during the visit. Patient screened for covid symptoms and denied having any.

## 2019-07-25 ENCOUNTER — Encounter: Payer: Self-pay | Admitting: Family Medicine

## 2019-07-25 ENCOUNTER — Ambulatory Visit (INDEPENDENT_AMBULATORY_CARE_PROVIDER_SITE_OTHER): Payer: Self-pay | Admitting: Family Medicine

## 2019-07-25 ENCOUNTER — Other Ambulatory Visit: Payer: Self-pay

## 2019-07-25 DIAGNOSIS — O365930 Maternal care for other known or suspected poor fetal growth, third trimester, not applicable or unspecified: Secondary | ICD-10-CM

## 2019-07-25 DIAGNOSIS — Z98891 History of uterine scar from previous surgery: Secondary | ICD-10-CM

## 2019-07-25 DIAGNOSIS — O0993 Supervision of high risk pregnancy, unspecified, third trimester: Secondary | ICD-10-CM

## 2019-07-25 DIAGNOSIS — O09529 Supervision of elderly multigravida, unspecified trimester: Secondary | ICD-10-CM | POA: Insufficient documentation

## 2019-07-25 DIAGNOSIS — Z3A31 31 weeks gestation of pregnancy: Secondary | ICD-10-CM

## 2019-07-25 DIAGNOSIS — O34211 Maternal care for low transverse scar from previous cesarean delivery: Secondary | ICD-10-CM

## 2019-07-25 DIAGNOSIS — O0990 Supervision of high risk pregnancy, unspecified, unspecified trimester: Secondary | ICD-10-CM | POA: Insufficient documentation

## 2019-07-25 DIAGNOSIS — O365990 Maternal care for other known or suspected poor fetal growth, unspecified trimester, not applicable or unspecified: Secondary | ICD-10-CM | POA: Insufficient documentation

## 2019-07-25 DIAGNOSIS — O09523 Supervision of elderly multigravida, third trimester: Secondary | ICD-10-CM

## 2019-07-25 NOTE — Progress Notes (Addendum)
   PRENATAL VISIT NOTE Spanish interpreter: Eda used  Subjective:  Dawn [name] is a 38 y.o. [phone] at [redacted]w[redacted]d being seen today for transferring prenatal care from [facility] due to IUGR based on late u/s with sure LMP. No h/o IUGR previously.OB h/o SVD x 2, then C-section for breech, then VBAC x 1--last 2 babies are 7lbs.  She is currently monitored for the following issues for this high-risk pregnancy and has History of cesarean delivery; Hereditary disease in family possibly affecting fetus, affecting management of mother, antepartum condition or complication, not applicable or unspecified fetus; Hypertension in pregnancy; Late prenatal care, antepartum; Language barrier; Supervision of high risk pregnancy, antepartum; IUGR (intrauterine growth restriction) affecting care of mother; and AMA (advanced maternal age) multigravida 35+ on their problem list.  Patient reports no complaints.  Contractions: Not present. Vag. Bleeding: None.  Movement: Present. Denies leaking of fluid.   The following portions of the patient's history were reviewed and updated as appropriate: allergies, current medications, past family history, past medical history, past social history, past surgical history and problem list.   Objective:   Vitals:   07/25/19 1000  BP: 122/77  Pulse: 73  Weight: 122 lb (55.3 kg)    Fetal Status: Fetal Heart Rate (bpm): 138   Movement: Present     General:  Alert, oriented and cooperative. Patient is in no acute distress.  Skin: Skin is warm and dry. No rash noted.   Cardiovascular: Normal heart rate noted  Respiratory: Normal respiratory effort, no problems with respiration noted  Abdomen: Soft, gravid, appropriate for gestational age.  Pain/Pressure: Absent     Pelvic: Cervical exam deferred        Extremities: Normal range of motion.  Edema: None  Mental Status: Normal mood and affect. Normal behavior. Normal judgment and thought content.   Assessment and Plan:   Pregnancy: [id number] at [redacted]w[redacted]d 1. History of cesarean delivery For VBAC again, will need consent  2. Supervision of high risk pregnancy, antepartum Has had everything at the [facility]  3. Intrauterine growth restriction (IUGR) affecting care of mother, third trimester, single or unspecified fetus Has MFM weekly BPP  52. Multigravida of advanced maternal age in third trimester Declines testing  Preterm labor symptoms and general obstetric precautions including but not limited to vaginal bleeding, contractions, leaking of fluid and fetal movement were reviewed in detail with the patient. Please refer to After Visit Summary for other counseling recommendations.   Return in about 2 weeks (around 08/08/2019) for in person, [facility].  Future Appointments  Date Time Provider [place]  07/26/2019  8:45 AM [place] [facility]  07/26/2019  8:45 AM [place] [facility]  08/15/2019  3:15 PM Emily Filbert, MD [facility] WOC    Donnamae Jude, MD

## 2019-07-26 ENCOUNTER — Ambulatory Visit (HOSPITAL_COMMUNITY): Payer: Self-pay | Admitting: *Deleted

## 2019-07-26 ENCOUNTER — Other Ambulatory Visit (HOSPITAL_COMMUNITY): Payer: Self-pay | Admitting: *Deleted

## 2019-07-26 ENCOUNTER — Ambulatory Visit (HOSPITAL_COMMUNITY)
Admission: RE | Admit: 2019-07-26 | Discharge: 2019-07-26 | Disposition: A | Discharge status: Home / Self Care | Payer: Self-pay | Source: Ambulatory Visit | Attending: Obstetrics and Gynecology | Admitting: Obstetrics and Gynecology

## 2019-07-26 ENCOUNTER — Encounter (HOSPITAL_COMMUNITY): Payer: Self-pay

## 2019-07-26 DIAGNOSIS — O34219 Maternal care for unspecified type scar from previous cesarean delivery: Secondary | ICD-10-CM

## 2019-07-26 DIAGNOSIS — O0930 Supervision of pregnancy with insufficient antenatal care, unspecified trimester: Secondary | ICD-10-CM | POA: Insufficient documentation

## 2019-07-26 DIAGNOSIS — O09523 Supervision of elderly multigravida, third trimester: Secondary | ICD-10-CM

## 2019-07-26 DIAGNOSIS — O0990 Supervision of high risk pregnancy, unspecified, unspecified trimester: Secondary | ICD-10-CM

## 2019-07-26 DIAGNOSIS — Z3A31 31 weeks gestation of pregnancy: Secondary | ICD-10-CM

## 2019-07-26 DIAGNOSIS — Z362 Encounter for other antenatal screening follow-up: Secondary | ICD-10-CM

## 2019-07-26 DIAGNOSIS — O365990 Maternal care for other known or suspected poor fetal growth, unspecified trimester, not applicable or unspecified: Secondary | ICD-10-CM | POA: Insufficient documentation

## 2019-07-26 DIAGNOSIS — O365930 Maternal care for other known or suspected poor fetal growth, third trimester, not applicable or unspecified: Secondary | ICD-10-CM

## 2019-07-26 DIAGNOSIS — Z789 Other specified health status: Secondary | ICD-10-CM

## 2019-08-02 ENCOUNTER — Ambulatory Visit (HOSPITAL_COMMUNITY)
Admission: RE | Admit: 2019-08-02 | Discharge: 2019-08-02 | Disposition: A | Discharge status: Home / Self Care | Payer: Self-pay | Source: Ambulatory Visit | Attending: Obstetrics and Gynecology | Admitting: Obstetrics and Gynecology

## 2019-08-02 ENCOUNTER — Other Ambulatory Visit: Payer: Self-pay

## 2019-08-02 ENCOUNTER — Ambulatory Visit (HOSPITAL_COMMUNITY): Payer: Self-pay | Admitting: *Deleted

## 2019-08-02 ENCOUNTER — Encounter (HOSPITAL_COMMUNITY): Payer: Self-pay

## 2019-08-02 ENCOUNTER — Other Ambulatory Visit (HOSPITAL_COMMUNITY): Payer: Self-pay | Admitting: Obstetrics and Gynecology

## 2019-08-02 DIAGNOSIS — O0990 Supervision of high risk pregnancy, unspecified, unspecified trimester: Secondary | ICD-10-CM | POA: Insufficient documentation

## 2019-08-02 DIAGNOSIS — O09523 Supervision of elderly multigravida, third trimester: Secondary | ICD-10-CM

## 2019-08-02 DIAGNOSIS — Z789 Other specified health status: Secondary | ICD-10-CM

## 2019-08-02 DIAGNOSIS — O0930 Supervision of pregnancy with insufficient antenatal care, unspecified trimester: Secondary | ICD-10-CM

## 2019-08-02 DIAGNOSIS — O365930 Maternal care for other known or suspected poor fetal growth, third trimester, not applicable or unspecified: Secondary | ICD-10-CM

## 2019-08-02 DIAGNOSIS — O34219 Maternal care for unspecified type scar from previous cesarean delivery: Secondary | ICD-10-CM

## 2019-08-02 DIAGNOSIS — Z3A32 32 weeks gestation of pregnancy: Secondary | ICD-10-CM

## 2019-08-09 ENCOUNTER — Ambulatory Visit (HOSPITAL_COMMUNITY)
Admission: RE | Admit: 2019-08-09 | Discharge: 2019-08-09 | Disposition: A | Discharge status: Home / Self Care | Payer: Self-pay | Source: Ambulatory Visit | Attending: Obstetrics and Gynecology | Admitting: Obstetrics and Gynecology

## 2019-08-09 ENCOUNTER — Ambulatory Visit (HOSPITAL_COMMUNITY): Payer: Self-pay

## 2019-08-15 ENCOUNTER — Encounter: Payer: Self-pay | Admitting: Obstetrics & Gynecology

## 2019-08-15 ENCOUNTER — Encounter: Payer: Self-pay | Admitting: Family Medicine

## 2019-08-16 ENCOUNTER — Ambulatory Visit (HOSPITAL_COMMUNITY): Payer: Self-pay

## 2019-08-16 ENCOUNTER — Ambulatory Visit (HOSPITAL_COMMUNITY)
Admission: RE | Admit: 2019-08-16 | Discharge: 2019-08-16 | Disposition: A | Discharge status: Home / Self Care | Payer: Self-pay | Source: Ambulatory Visit | Attending: Obstetrics and Gynecology | Admitting: Obstetrics and Gynecology

## 2019-09-19 ENCOUNTER — Other Ambulatory Visit: Payer: Self-pay

## 2019-09-19 ENCOUNTER — Encounter (HOSPITAL_COMMUNITY): Payer: Self-pay

## 2019-09-19 ENCOUNTER — Encounter (HOSPITAL_COMMUNITY): Payer: Self-pay | Admitting: Anesthesiology

## 2019-09-19 ENCOUNTER — Inpatient Hospital Stay (HOSPITAL_COMMUNITY)
Admission: AD | Admit: 2019-09-19 | Discharge: 2019-09-20 | DRG: 807 | Disposition: A | Discharge status: Home / Self Care | Payer: Self-pay | Attending: Family Medicine | Admitting: Family Medicine

## 2019-09-19 ENCOUNTER — Encounter (HOSPITAL_COMMUNITY)
Admission: AD | Disposition: A | Discharge status: Home / Self Care | Payer: Self-pay | Source: Home / Self Care | Attending: Family Medicine

## 2019-09-19 DIAGNOSIS — O134 Gestational [pregnancy-induced] hypertension without significant proteinuria, complicating childbirth: Principal | ICD-10-CM | POA: Diagnosis present

## 2019-09-19 DIAGNOSIS — Z23 Encounter for immunization: Secondary | ICD-10-CM

## 2019-09-19 DIAGNOSIS — O139 Gestational [pregnancy-induced] hypertension without significant proteinuria, unspecified trimester: Secondary | ICD-10-CM

## 2019-09-19 DIAGNOSIS — Z3A39 39 weeks gestation of pregnancy: Secondary | ICD-10-CM

## 2019-09-19 DIAGNOSIS — Z20828 Contact with and (suspected) exposure to other viral communicable diseases: Secondary | ICD-10-CM | POA: Diagnosis present

## 2019-09-19 DIAGNOSIS — O34219 Maternal care for unspecified type scar from previous cesarean delivery: Secondary | ICD-10-CM | POA: Diagnosis present

## 2019-09-19 DIAGNOSIS — O365930 Maternal care for other known or suspected poor fetal growth, third trimester, not applicable or unspecified: Secondary | ICD-10-CM | POA: Diagnosis present

## 2019-09-19 LAB — COMPREHENSIVE METABOLIC PANEL
ALT: 10 U/L (ref 0–44)
AST: 22 U/L (ref 15–41)
Albumin: 3.1 g/dL — ABNORMAL LOW (ref 3.5–5.0)
Alkaline Phosphatase: 235 U/L — ABNORMAL HIGH (ref 38–126)
Anion gap: 11 (ref 5–15)
BUN: 6 mg/dL (ref 6–20)
CO2: 20 mmol/L — ABNORMAL LOW (ref 22–32)
Calcium: 9.1 mg/dL (ref 8.9–10.3)
Chloride: 106 mmol/L (ref 98–111)
Creatinine, Ser: 0.51 mg/dL (ref 0.44–1.00)
GFR calc Af Amer: >60 mL/min (ref >60)
GFR calc non Af Amer: >60 mL/min (ref >60)
Glucose, Bld: 89 mg/dL (ref 70–99)
Potassium: 3.4 mmol/L — ABNORMAL LOW (ref 3.5–5.1)
Sodium: 137 mmol/L (ref 135–145)
Total Bilirubin: 0.4 mg/dL (ref 0.3–1.2)
Total Protein: 6.7 g/dL (ref 6.5–8.1)

## 2019-09-19 LAB — CBC
HCT: 39.2 % (ref 36.0–46.0)
Hemoglobin: 13.3 g/dL (ref 12.0–15.0)
MCH: 29.6 pg (ref 26.0–34.0)
MCHC: 33.9 g/dL (ref 30.0–36.0)
MCV: 87.1 fL (ref 80.0–100.0)
Platelets: 212 10*3/uL (ref 150–400)
RBC: 4.5 MIL/uL (ref 3.87–5.11)
RDW: 12.6 % (ref 11.5–15.5)
WBC: 10.9 10*3/uL — ABNORMAL HIGH (ref 4.0–10.5)
nRBC: 0 % (ref 0.0–0.2)

## 2019-09-19 LAB — PROTEIN / CREATININE RATIO, URINE
Creatinine, Urine: 79.93 mg/dL
Protein Creatinine Ratio: 0.2 mg/mg{Cre} — ABNORMAL HIGH (ref 0.00–0.15)
Total Protein, Urine: 16 mg/dL

## 2019-09-19 LAB — TYPE AND SCREEN
ABO/RH(D): O POS
Antibody Screen: NEGATIVE

## 2019-09-19 LAB — RPR: RPR Ser Ql: NONREACTIVE

## 2019-09-19 LAB — ABO/RH: ABO/RH(D): O POS

## 2019-09-19 LAB — SARS CORONAVIRUS 2 BY RT PCR (HOSPITAL ORDER, PERFORMED IN ~~LOC~~ HOSPITAL LAB): SARS Coronavirus 2: NEGATIVE

## 2019-09-19 SURGERY — Surgical Case
Anesthesia: Regional

## 2019-09-19 MED ORDER — BENZOCAINE-MENTHOL 20-0.5 % EX AERO: 1.0000 "application " | INHALATION_SPRAY | CUTANEOUS | Status: DC | PRN

## 2019-09-19 MED ORDER — LACTATED RINGERS IV SOLN
INTRAVENOUS | Status: DC
  Administered 2019-09-19: 07:00:00 via INTRAVENOUS

## 2019-09-19 MED ORDER — DIBUCAINE (PERIANAL) 1 % EX OINT: 1.0000 "application " | TOPICAL_OINTMENT | CUTANEOUS | Status: DC | PRN

## 2019-09-19 MED ORDER — FENTANYL CITRATE (PF) 100 MCG/2ML IJ SOLN
INTRAMUSCULAR | Status: AC
  Filled 2019-09-19: qty 2

## 2019-09-19 MED ORDER — OXYCODONE HCL 5 MG PO TABS: 5.0000 mg | ORAL_TABLET | ORAL | Status: DC | PRN

## 2019-09-19 MED ORDER — SODIUM CHLORIDE 0.9 % IV SOLN: 500.0000 mg | INTRAVENOUS | Status: DC

## 2019-09-19 MED ORDER — OXYTOCIN 40 UNITS IN NORMAL SALINE INFUSION - SIMPLE MED
INTRAVENOUS | Status: AC
  Filled 2019-09-19: qty 1000

## 2019-09-19 MED ORDER — ONDANSETRON HCL 4 MG/2ML IJ SOLN
INTRAMUSCULAR | Status: AC
  Filled 2019-09-19: qty 2

## 2019-09-19 MED ORDER — CEFAZOLIN SODIUM-DEXTROSE 2-4 GM/100ML-% IV SOLN: 2.0000 g | INTRAVENOUS | Status: DC

## 2019-09-19 MED ORDER — OXYCODONE HCL 5 MG PO TABS: 10.0000 mg | ORAL_TABLET | ORAL | Status: DC | PRN

## 2019-09-19 MED ORDER — MAGNESIUM HYDROXIDE 400 MG/5ML PO SUSP: 30.0000 mL | ORAL | Status: DC | PRN

## 2019-09-19 MED ORDER — COCONUT OIL OIL: 1.0000 "application " | TOPICAL_OIL | Status: DC | PRN

## 2019-09-19 MED ORDER — INFLUENZA VAC SPLIT QUAD 0.5 ML IM SUSY
0.5000 mL | PREFILLED_SYRINGE | INTRAMUSCULAR | Status: AC
  Administered 2019-09-20: 12:00:00 0.5 mL via INTRAMUSCULAR
  Filled 2019-09-19: qty 0.5

## 2019-09-19 MED ORDER — FENTANYL CITRATE (PF) 100 MCG/2ML IJ SOLN
50.0000 ug | INTRAMUSCULAR | Status: DC | PRN
  Filled 2019-09-19: qty 2

## 2019-09-19 MED ORDER — ACETAMINOPHEN 325 MG PO TABS: 650.0000 mg | ORAL_TABLET | ORAL | Status: DC | PRN

## 2019-09-19 MED ORDER — MORPHINE SULFATE (PF) 0.5 MG/ML IJ SOLN
INTRAMUSCULAR | Status: AC
  Filled 2019-09-19: qty 10

## 2019-09-19 MED ORDER — ONDANSETRON HCL 4 MG/2ML IJ SOLN: 4.0000 mg | INTRAMUSCULAR | Status: DC | PRN

## 2019-09-19 MED ORDER — SOD CITRATE-CITRIC ACID 500-334 MG/5ML PO SOLN
30.0000 mL | ORAL | Status: DC | PRN
  Administered 2019-09-19: 30 mL via ORAL
  Filled 2019-09-19: qty 30

## 2019-09-19 MED ORDER — DIPHENHYDRAMINE HCL 25 MG PO CAPS: 25.0000 mg | ORAL_CAPSULE | Freq: Four times a day (QID) | ORAL | Status: DC | PRN

## 2019-09-19 MED ORDER — OXYTOCIN BOLUS FROM INFUSION
500.0000 mL | Freq: Once | INTRAVENOUS | Status: AC
  Administered 2019-09-19: 07:00:00 500 mL via INTRAVENOUS

## 2019-09-19 MED ORDER — IBUPROFEN 600 MG PO TABS
600.0000 mg | ORAL_TABLET | Freq: Four times a day (QID) | ORAL | Status: DC
  Administered 2019-09-19 – 2019-09-20 (×4): 600 mg via ORAL
  Filled 2019-09-19 (×5): qty 1

## 2019-09-19 MED ORDER — ONDANSETRON HCL 4 MG/2ML IJ SOLN: 4.0000 mg | Freq: Four times a day (QID) | INTRAMUSCULAR | Status: DC | PRN

## 2019-09-19 MED ORDER — PRENATAL MULTIVITAMIN CH
1.0000 | ORAL_TABLET | Freq: Every day | ORAL | Status: DC
  Administered 2019-09-20: 12:00:00 1 via ORAL
  Filled 2019-09-19 (×2): qty 1

## 2019-09-19 MED ORDER — ONDANSETRON HCL 4 MG PO TABS: 4.0000 mg | ORAL_TABLET | ORAL | Status: DC | PRN

## 2019-09-19 MED ORDER — OXYTOCIN 40 UNITS IN NORMAL SALINE INFUSION - SIMPLE MED
2.5000 [IU]/h | INTRAVENOUS | Status: DC
  Administered 2019-09-19: 2.5 [IU]/h via INTRAVENOUS
  Filled 2019-09-19: qty 1000

## 2019-09-19 MED ORDER — SIMETHICONE 80 MG PO CHEW: 80.0000 mg | CHEWABLE_TABLET | ORAL | Status: DC | PRN

## 2019-09-19 MED ORDER — LIDOCAINE HCL (PF) 1 % IJ SOLN
30.0000 mL | INTRAMUSCULAR | Status: AC | PRN
  Administered 2019-09-19: 08:00:00 30 mL via SUBCUTANEOUS
  Filled 2019-09-19: qty 30

## 2019-09-19 MED ORDER — SENNOSIDES-DOCUSATE SODIUM 8.6-50 MG PO TABS
2.0000 | ORAL_TABLET | ORAL | Status: DC
  Administered 2019-09-19: 2 via ORAL
  Filled 2019-09-19: qty 2

## 2019-09-19 MED ORDER — LACTATED RINGERS IV SOLN: 500.0000 mL | INTRAVENOUS | Status: DC | PRN

## 2019-09-19 MED ORDER — WITCH HAZEL-GLYCERIN EX PADS: 1.0000 "application " | MEDICATED_PAD | CUTANEOUS | Status: DC | PRN

## 2019-09-19 NOTE — Lactation Note (Signed)
This note was copied from a baby's chart. Lactation Consultation Note  Patient Name: Dawn [name] EVOJJ'K Date: 09/19/2019 Reason for consult: Initial assessment   P5 mom with bf exp. Of 1 year with each of her previous children.  Interpreter at bedside.  Mom denies questions at this time.  She prefers to breastfeed and formula feed.  LC left hand pump with mom in case she desires to bottle feed her breastmilk in the future.  Pump parts and storage explained.  Lactation brochure with phone numbers shared with mom.   Maternal Data Does the patient have breastfeeding experience prior to this delivery?: Yes  Feeding Feeding Type: Formula  LATCH Score                   Interventions Interventions: Breast feeding basics reviewed;Hand pump  Lactation Tools Discussed/Used     Consult Status Consult Status: Follow-up Date: 09/20/19 Follow-up type: In-patient    [name] [facility] 09/19/2019, 7:38 PM

## 2019-09-19 NOTE — Progress Notes (Signed)
Spanish interrupter used to do admission assessment.

## 2019-09-19 NOTE — H&P (Signed)
LABOR AND DELIVERY ADMISSION HISTORY AND PHYSICAL NOTE  Dawn [name] is a 38 y.o. female [id number] with IUP at 5776w3d by LMP c/w 28wk US presenting for spontaneous labor.   She reports positive fetal movement. She denies leakage of fluid or vaginal bleeding.   She plans on breast and bottle feeding. She requests BTL for birth control.  On arrival to the floor on reviewing VBAC consent form patient [facility] and would like to have [facility]. Subsequently had SROM and precipitous delivery.   Prenatal History/Complications: [facility]  Pregnancy complications:  - IUGR>resolved - Hx of VBAC x1 - Limited PNC - AMA  Past Medical History: Past Medical History:  Diagnosis Date  . Language barrier    Hospital Interpreter Eda Royal assisted with pat history  . No pertinent past medical history     Past Surgical History: Past Surgical History:  Procedure Laterality Date  . CESAREAN SECTION  02/01/2012   Procedure: CESAREAN SECTION;  Surgeon: Scheryl DarterJames Arnold, MD;  Location: [facility];  Service: Gynecology;  Laterality: N/A;  ultrasound prior c/s  . DILATION AND EVACUATION N/A 01/12/2016   Procedure: DILATATION AND EVACUATION;  Surgeon: Levie HeritageJacob J Stinson, DO;  Location: [facility];  Service: Gynecology;  Laterality: N/A;  missed AB 12-14 weeks   . DILATION AND EVACUATION N/A 01/15/2016   Procedure: DILATATION AND EVACUATION;  Surgeon: Adam PhenixJames G Arnold, MD;  Location: [facility];  Service: Gynecology;  Laterality: N/A;  . NO PAST SURGERIES    . VAGINAL DELIVERY  x2    Obstetrical History: OB History    Gravida  6   Para  4   Term  4   Preterm      AB  1   Living  4     SAB  1   TAB      Ectopic      Multiple  0   Live Births  4           Social History: Social History   Socioeconomic History  . Marital status: Single    Spouse name: Not on file  . Number of children: Not on file  . Years of education: Not on file  . Highest education level:  Not on file  Occupational History  . Not on file  Social Needs  . Financial resource strain: Not on file  . Food insecurity    Worry: Not on file    Inability: Not on file  . Transportation needs    Medical: Not on file    Non-medical: Not on file  Tobacco Use  . Smoking status: Never Smoker  . Smokeless tobacco: Never Used  Substance and Sexual Activity  . Alcohol use: No  . Drug use: No  . Sexual activity: Not on file  Lifestyle  . Physical activity    Days per week: Not on file    Minutes per session: Not on file  . Stress: Not on file  Relationships  . Social Musicianconnections    Talks on phone: Not on file    Gets together: Not on file    Attends religious service: Not on file    Active member of club or organization: Not on file    Attends meetings of clubs or organizations: Not on file    Relationship status: Not on file  Other Topics Concern  . Not on file  Social History Narrative  . Not on file    Family  History: History reviewed. No pertinent family history.  Allergies: No Known Allergies  Medications Prior to Admission  Medication Sig Dispense Refill Last Dose  . docusate sodium (COLACE) 100 MG capsule Take 1 capsule (100 mg total) by mouth 2 (two) times daily. (Patient not taking: Reported on 07/19/2019) 30 capsule 0   . Prenatal Vit-Fe Fumarate-FA (PRENATAL MULTIVITAMIN) TABS Take 1 tablet by mouth daily.        Review of Systems  All systems reviewed and negative except as stated in HPI  Physical Exam Blood pressure (!) 125/96, pulse 67, temperature 98.1 F (36.7 C), temperature source Oral, resp. rate 18, height 5' (1.524 m), weight 55.3 kg, last menstrual period 12/17/2018, SpO2 100 %, unknown if currently breastfeeding. General appearance: alert, oriented, NAD Lungs: normal respiratory effort Heart: regular rate Abdomen: soft, non-tender; gravid Extremities: No calf swelling or tenderness Presentation: cephalic by SVE  Dilation: 10 Effacement  (%): 100 Station: -2 Exam by:: Dr. Adrian Blackwater   Prenatal labs: ABO, Rh: --/--/O POS (10/21 4332) Antibody: NEG (10/21 9518) Rubella: Immune (08/03 0000) RPR: Nonreactive (08/03 0000)  HBsAg: Negative (08/03 0000)  HIV: Non-reactive (08/03 0000)  GC/Chlamydia: pending  GBS:   unknown 2-hr GTT: 1hr normal 07/02/2019 Genetic screening:  declined Anatomy US: normal  Prenatal Transfer Tool  Maternal Diabetes: No Genetic Screening: Declined Maternal Ultrasounds/Referrals: Normal Fetal Ultrasounds or other Referrals:  None Maternal Substance Abuse:  No Significant Maternal Medications:  None Significant Maternal Lab Results: None  Results for orders placed or performed during the hospital encounter of 09/19/19 (from the past 24 hour(s))  Protein / creatinine ratio, urine   Collection Time: 09/19/19  5:39 AM  Result Value Ref Range   Creatinine, Urine 79.93 mg/dL   Total Protein, Urine 16 mg/dL   Protein Creatinine Ratio 0.20 (H) 0.00 - 0.15 mg/mg[Cre]  CBC   Collection Time: 09/19/19  5:43 AM  Result Value Ref Range   WBC 10.9 (H) 4.0 - 10.5 K/uL   RBC 4.50 3.87 - 5.11 MIL/uL   Hemoglobin 13.3 12.0 - 15.0 g/dL   HCT 84.1 66.0 - 63.0 %   MCV 87.1 80.0 - 100.0 fL   MCH 29.6 26.0 - 34.0 pg   MCHC 33.9 30.0 - 36.0 g/dL   RDW 16.0 10.9 - 32.3 %   Platelets 212 150 - 400 K/uL   nRBC 0.0 0.0 - 0.2 %  Comprehensive metabolic panel   Collection Time: 09/19/19  5:43 AM  Result Value Ref Range   Sodium 137 135 - 145 mmol/L   Potassium 3.4 (L) 3.5 - 5.1 mmol/L   Chloride 106 98 - 111 mmol/L   CO2 20 (L) 22 - 32 mmol/L   Glucose, Bld 89 70 - 99 mg/dL   BUN 6 6 - 20 mg/dL   Creatinine, Ser 5.57 0.44 - 1.00 mg/dL   Calcium 9.1 8.9 - 32.2 mg/dL   Total Protein 6.7 6.5 - 8.1 g/dL   Albumin 3.1 (L) 3.5 - 5.0 g/dL   AST 22 15 - 41 U/L   ALT 10 0 - 44 U/L   Alkaline Phosphatase 235 (H) 38 - 126 U/L   Total Bilirubin 0.4 0.3 - 1.2 mg/dL   GFR calc non Af Amer >60 >60 mL/min   GFR calc  Af Amer >60 >60 mL/min   Anion gap 11 5 - 15  Type and screen [facility]   Collection Time: 09/19/19  6:13 AM  Result Value Ref Range  ABO/RH(D) O POS    Antibody Screen NEG    Sample Expiration      09/22/2019,2359 Performed at [facility], [place] [address], [place], [place] [address]   SARS Coronavirus 2 by RT PCR (hospital order, performed in [facility] hospital lab) Nasopharyngeal Nasopharyngeal Swab   Collection Time: 09/19/19  6:22 AM   Specimen: Nasopharyngeal Swab  Result Value Ref Range   SARS Coronavirus 2 NEGATIVE NEGATIVE    Patient Active Problem List   Diagnosis Date Noted  . Normal labor 09/19/2019  . Supervision of high risk pregnancy, antepartum 07/25/2019  . IUGR (intrauterine growth restriction) affecting care of mother 07/25/2019  . AMA (advanced maternal age) multigravida 35+ 07/25/2019  . Late prenatal care, antepartum 07/18/2019  . Language barrier 07/18/2019  . Hypertension in pregnancy 01/20/2017  . Hereditary disease in family possibly affecting fetus, affecting management of mother, antepartum condition or complication, not applicable or unspecified fetus   . History of cesarean delivery 02/04/2012    Assessment: Dawn [name] is a 38 y.o. [id number] at [redacted]w[redacted]d here for spontaneous labor who subsequently had a precipitous delivery on arrival to the floor.   #Labor: expectantly managed #Pain: none #FWB:  #GBS/ID:  unknown #COVID: swab negative #MOF: bottle and breast #MOC: would like to have BTL but is self pay. Counseled on LARC options through [facility].  #Circ:  n/a  #Elevated BPs: multiple mild range BP's since arrival, labs unremarkable. Cont to monitor.   Annice Needy [facility] 09/19/2019, 7:59 AM

## 2019-09-19 NOTE — MAU Provider Note (Signed)
Chief Complaint:  Contractions   Seen by provider at 0510    HPI: Dawn [name] is a 38 y.o. [id number] at 50w3dwho presents to maternity admissions reporting uterine contractions for 2 hours.  . She reports good fetal movement, denies LOF, vaginal bleeding, vaginal itching/burning, urinary symptoms, h/a, dizziness, n/v, diarrhea, constipation or fever/chills.  She denies headache, visual changes or RUQ abdominal pain.  Was followed in [facility] initially then transferred to clinic for IUGR.   Past Medical History: Past Medical History:  Diagnosis Date  . Language barrier    [place] assisted with pat history  . No pertinent past medical history     Past obstetric history: OB History  Gravida Para Term Preterm AB Living  6 4 4   1 4   SAB TAB Ectopic Multiple Live Births  1     0 4    # Outcome Date GA Lbr Len/2nd Weight Sex Delivery Anes PTL Lv  6 Current           5 Term 01/20/17 [redacted]w[redacted]d 12:45 / 00:19 3475 g F VBAC None  LIV  4 SAB 01/12/16          3 Term 02/01/12 [redacted]w[redacted]d  3355 g M CS-LTranv   LIV     Birth Comments: c/s due to breech  2 Term 09/10/08     Vag-Spont   LIV  1 Term 07/13/05     Vag-Spont   LIV    Past Surgical History: Past Surgical History:  Procedure Laterality Date  . CESAREAN SECTION  02/01/2012   Procedure: CESAREAN SECTION;  Surgeon: Emeterio Reeve, MD;  Location: [place] [facility];  Service: Gynecology;  Laterality: N/A;  ultrasound prior c/s  . DILATION AND EVACUATION N/A 01/12/2016   Procedure: DILATATION AND EVACUATION;  Surgeon: Truett Mainland, DO;  Location: [place] [facility];  Service: Gynecology;  Laterality: N/A;  missed AB 12-14 weeks   . DILATION AND EVACUATION N/A 01/15/2016   Procedure: DILATATION AND EVACUATION;  Surgeon: Woodroe Mode, MD;  Location: [place] [facility];  Service: Gynecology;  Laterality: N/A;  . NO PAST SURGERIES    . VAGINAL DELIVERY  x2    Family History: History reviewed. No pertinent family history.  Social  History: Social History   Tobacco Use  . Smoking status: Never Smoker  . Smokeless tobacco: Never Used  Substance Use Topics  . Alcohol use: No  . Drug use: No    Allergies: No Known Allergies  Meds:  Medications Prior to Admission  Medication Sig Dispense Refill Last Dose  . docusate sodium (COLACE) 100 MG capsule Take 1 capsule (100 mg total) by mouth 2 (two) times daily. (Patient not taking: Reported on 07/19/2019) 30 capsule 0   . Prenatal Vit-Fe Fumarate-FA (PRENATAL MULTIVITAMIN) TABS Take 1 tablet by mouth daily.       I have reviewed patient's Past Medical Hx, Surgical Hx, Family Hx, Social Hx, medications and allergies.   ROS:  Review of Systems  Constitutional: Negative for chills and fever.  Eyes: Negative for visual disturbance.  Respiratory: Negative for shortness of breath.   Gastrointestinal: Positive for abdominal pain.  Genitourinary: Positive for pelvic pain. Negative for vaginal bleeding.  Musculoskeletal: Negative for back pain.  Neurological: Negative for dizziness, weakness and headaches.   Other systems negative  Physical Exam   Patient Vitals for the past 24 hrs:  BP Temp Temp src Pulse Resp SpO2 Weight  09/19/19 0515 (!) 142/88 - - 82 - 100 % -  09/19/19 0502 125/88 - - 77 - - -  09/19/19 0450 (!) 131/92 - - 80 - - -  09/19/19 0449 (!) 131/92 97.9 F (36.6 C) Oral 84 17 100 % -  09/19/19 0443 - - - - - - 57.3 kg   Constitutional: Well-developed, well-nourished female in no acute distress.  Cardiovascular: normal rate and rhythm Respiratory: normal effort, clear to auscultation bilaterally GI: Abd soft, non-tender, gravid appropriate for gestational age.   No rebound or guarding. MS: Extremities nontender, no edema, normal ROM Neurologic: Alert and oriented x 4.  GU: Neg CVAT.  PELVIC EXAM: Dilation: 3 Effacement (%): 90 Presentation: Undeterminable Exam by:: Latricia Heft, RN  FHT:  Baseline 140 , moderate variability, accelerations  present, no decelerations Contractions: Irregular     O/Positive/-- (08/03 0000)  Imaging:  Pt informed that the ultrasound is considered a limited OB ultrasound and is not intended to be a complete ultrasound exam.  Patient also informed that the ultrasound is not being completed with the intent of assessing for fetal or placental anomalies or any pelvic abnormalities.  Explained that the purpose of today's ultrasound is to assess for presentation.  Patient acknowledges the purpose of the exam and the limitations of the study.    US done which confirm vertex presentation    Plan: Admit Labor team to follow  Wynelle Bourgeois CNM, MSN Certified Nurse-Midwife 09/19/2019 5:34 AM

## 2019-09-19 NOTE — MAU Note (Signed)
Started contracting 1 hour ago.  No LOF/VB.  Endorses + FM.

## 2019-09-19 NOTE — Discharge Summary (Signed)
Postpartum Discharge Summary     Patient Name: Dawn [name] DOB: [birth year] MRN: [id number]  Date of admission: 09/19/2019 Delivering Provider: Clarnce Flock   Date of discharge: 09/20/2019  Admitting diagnosis: 8WKS CTX Intrauterine pregnancy: [redacted]w[redacted]d    Secondary diagnosis:  Active Problems:   Normal labor   Gestational hypertension   Additional problems:      Discharge diagnosis: Term Pregnancy Delivered and VBAC                                                                                            Post partum procedures:None  Augmentation: None  Complications: None  Hospital course:  Onset of Labor With Vaginal Delivery     38y.o. yo [id number] 371w3das admitted in Latent Labor on 09/19/2019. Patient had an uncomplicated labor course as follows: Patient arrived at 3 cm dilation and subsequently progressed to 8cm by the time she arrived to the floor. During consent for VBAC patient [facility]. During that time patient progressed to fully dilated and had SROM. She subsequently delivered quickly thereafter.  Membrane Rupture Time/Date: 7:15 AM ,09/19/2019   Intrapartum Procedures: Episiotomy: None [1]                                         Lacerations:  Periurethral [8]  Patient had a delivery of a Viable infant. 09/19/2019  Information for the patient's newborn:  CaRondall Allegrairl OlMeloney0[[id number]]Delivery Method: Vag-Spont     Pateint had an uncomplicated postpartum course.   She had several mild range BP's with normal PIH labs consistent with gestational hypertension and was asymptomatic at the time of discharge. A BP check was already scheduled for 1 week at the time of discharge.   She was ambulating, tolerating a regular diet, passing flatus, and urinating well. Patient is discharged home in stable condition on 09/20/19.  Delivery time: 7:20 AM    Magnesium Sulfate received:  No BMZ received: No Rhophylac:N/A MMR:N/A Transfusion:No  Physical exam  Vitals:   09/19/19 1427 09/19/19 1730 09/19/19 2100 09/20/19 0516  BP: 102/75 (!) 120/91 101/76 101/71  Pulse: 69 64 76 67  Resp: '17 18 18 17  '$ Temp: 98.7 F (37.1 C) 97.7 F (36.5 C) 98.1 F (36.7 C) 98.5 F (36.9 C)  TempSrc: Oral Oral Oral Oral  SpO2:  100% 100% 100%  Weight:      Height:       General: alert, cooperative and no distress Lochia: appropriate Uterine Fundus: firm Incision: N/A DVT Evaluation: No evidence of DVT seen on physical exam. No significant calf/ankle edema. Labs: Lab Results  Component Value Date   WBC 10.9 (H) 09/19/2019   HGB 13.3 09/19/2019   HCT 39.2 09/19/2019   MCV 87.1 09/19/2019   PLT 212 09/19/2019   CMP Latest Ref Rng & Units 09/19/2019  Glucose 70 - 99 mg/dL 89  BUN 6 - 20 mg/dL 6  Creatinine 0.44 - 1.00  mg/dL 0.51  Sodium 135 - 145 mmol/L 137  Potassium 3.5 - 5.1 mmol/L 3.4(L)  Chloride 98 - 111 mmol/L 106  CO2 22 - 32 mmol/L 20(L)  Calcium 8.9 - 10.3 mg/dL 9.1  Total Protein 6.5 - 8.1 g/dL 6.7  Total Bilirubin 0.3 - 1.2 mg/dL 0.4  Alkaline Phos 38 - 126 U/L 235(H)  AST 15 - 41 U/L 22  ALT 0 - 44 U/L 10    Discharge instruction: per After Visit Summary and "Baby and Me Booklet".  After visit meds:  Allergies as of 09/20/2019   No Known Allergies     Medication List    STOP taking these medications   docusate sodium 100 MG capsule Commonly known as: COLACE     TAKE these medications   acetaminophen 325 MG tablet Commonly known as: Tylenol Take 2 tablets (650 mg total) by mouth every 4 (four) hours as needed (for pain scale < 4).   ibuprofen 600 MG tablet Commonly known as: ADVIL Take 1 tablet (600 mg total) by mouth every 6 (six) hours.   prenatal multivitamin Tabs tablet Take 1 tablet by mouth daily.       Diet: routine diet  Activity: Advance as tolerated. Pelvic rest for 6 weeks.   Outpatient follow up:6 weeks Follow up  Appt: Future Appointments  Date Time Provider [place]  09/27/2019  9:20 AM [place]  10/18/2019  3:35 PM Rasch, Artist Pais, NP [facility]   Follow up Visit:   Please schedule this patient for Postpartum visit in: 6 weeks with the following provider: Any provider  For C/S patients schedule nurse incision check in weeks 2 weeks: no  High risk pregnancy complicated by: HTN  Delivery mode: SVD  Anticipated Birth Control: LARC, undecided on method  PP Procedures needed: BP check  Schedule Integrated [place] visit: no    Newborn Data: Live born female  Birth Weight:  3070 grams APGAR: 16, 9  Newborn Delivery   Birth date/time: 09/19/2019 07:20:00 Delivery type: Vaginal, Spontaneous      Baby Feeding: Bottle and Breast Disposition:home with mother   09/20/2019 Clarnce Flock, MD

## 2019-09-20 DIAGNOSIS — O139 Gestational [pregnancy-induced] hypertension without significant proteinuria, unspecified trimester: Secondary | ICD-10-CM

## 2019-09-20 MED ORDER — ACETAMINOPHEN 325 MG PO TABS: 650.0000 mg | ORAL_TABLET | ORAL | 0 refills | Status: DC | PRN

## 2019-09-20 MED ORDER — POLYETHYLENE GLYCOL 3350 17 GM/SCOOP PO POWD: 1.0000 | Freq: Once | ORAL | 0 refills | Status: AC

## 2019-09-20 MED ORDER — IBUPROFEN 600 MG PO TABS: 600.0000 mg | ORAL_TABLET | Freq: Four times a day (QID) | ORAL | 0 refills | Status: DC

## 2019-09-20 NOTE — Discharge Instructions (Signed)
Postpartum Care After Vaginal Delivery °This sheet gives you information about how to care for yourself from the time you deliver your baby to up to 6-12 weeks after delivery (postpartum period). Your health care provider may also give you more specific instructions. If you have problems or questions, contact your health care provider. °Follow these instructions at home: °Vaginal bleeding °· It is normal to have vaginal bleeding (lochia) after delivery. Wear a sanitary pad for vaginal bleeding and discharge. °? During the first week after delivery, the amount and appearance of lochia is often similar to a menstrual period. °? Over the next few weeks, it will gradually decrease to a dry, yellow-brown discharge. °? For most women, lochia stops completely by 4-6 weeks after delivery. Vaginal bleeding can vary from woman to woman. °· Change your sanitary pads frequently. Watch for any changes in your flow, such as: °? A sudden increase in volume. °? A change in color. °? Large blood clots. °· If you pass a blood clot from your vagina, save it and call your health care provider to discuss. Do not flush blood clots down the toilet before talking with your health care provider. °· Do not use tampons or douches until your health care provider says this is safe. °· If you are not breastfeeding, your period should return 6-8 weeks after delivery. If you are feeding your child breast milk only (exclusive breastfeeding), your period may not return until you stop breastfeeding. °Perineal care °· Keep the area between the vagina and the anus (perineum) clean and dry as told by your health care provider. Use medicated pads and pain-relieving sprays and creams as directed. °· If you had a cut in the perineum (episiotomy) or a tear in the vagina, check the area for signs of infection until you are healed. Check for: °? More redness, swelling, or pain. °? Fluid or blood coming from the cut or tear. °? Warmth. °? Pus or a bad  smell. °· You may be given a squirt bottle to use instead of wiping to clean the perineum area after you go to the bathroom. As you start healing, you may use the squirt bottle before wiping yourself. Make sure to wipe gently. °· To relieve pain caused by an episiotomy, a tear in the vagina, or swollen veins in the anus (hemorrhoids), try taking a warm sitz bath 2-3 times a day. A sitz bath is a warm water bath that is taken while you are sitting down. The water should only come up to your hips and should cover your buttocks. °Breast care °· Within the first few days after delivery, your breasts may feel heavy, full, and uncomfortable (breast engorgement). Milk may also leak from your breasts. Your health care provider can suggest ways to help relieve the discomfort. Breast engorgement should go away within a few days. °· If you are breastfeeding: °? Wear a bra that supports your breasts and fits you well. °? Keep your nipples clean and dry. Apply creams and ointments as told by your health care provider. °? You may need to use breast pads to absorb milk that leaks from your breasts. °? You may have uterine contractions every time you breastfeed for up to several weeks after delivery. Uterine contractions help your uterus return to its normal size. °? If you have any problems with breastfeeding, work with your health care provider or lactation consultant. °· If you are not breastfeeding: °? Avoid touching your breasts a lot. Doing this can make   your breasts produce more milk. °? Wear a good-fitting bra and use cold packs to help with swelling. °? Do not squeeze out (express) milk. This causes you to make more milk. °Intimacy and sexuality °· Ask your health care provider when you can engage in sexual activity. This may depend on: °? Your risk of infection. °? How fast you are healing. °? Your comfort and desire to engage in sexual activity. °· You are able to get pregnant after delivery, even if you have not had  your period. If desired, talk with your health care provider about methods of birth control (contraception). °Medicines °· Take over-the-counter and prescription medicines only as told by your health care provider. °· If you were prescribed an antibiotic medicine, take it as told by your health care provider. Do not stop taking the antibiotic even if you start to feel better. °Activity °· Gradually return to your normal activities as told by your health care provider. Ask your health care provider what activities are safe for you. °· Rest as much as possible. Try to rest or take a nap while your baby is sleeping. °Eating and drinking ° °· Drink enough fluid to keep your urine pale yellow. °· Eat high-fiber foods every day. These may help prevent or relieve constipation. High-fiber foods include: °? Whole grain cereals and breads. °? Brown rice. °? Beans. °? Fresh fruits and vegetables. °· Do not try to lose weight quickly by cutting back on calories. °· Take your prenatal vitamins until your postpartum checkup or until your health care provider tells you it is okay to stop. °Lifestyle °· Do not use any products that contain nicotine or tobacco, such as cigarettes and e-cigarettes. If you need help quitting, ask your health care provider. °· Do not drink alcohol, especially if you are breastfeeding. °General instructions °· Keep all follow-up visits for you and your baby as told by your health care provider. Most women visit their health care provider for a postpartum checkup within the first 3-6 weeks after delivery. °Contact a health care provider if: °· You feel unable to cope with the changes that your child brings to your life, and these feelings do not go away. °· You feel unusually sad or worried. °· Your breasts become red, painful, or hard. °· You have a fever. °· You have trouble holding urine or keeping urine from leaking. °· You have little or no interest in activities you used to enjoy. °· You have not  breastfed at all and you have not had a menstrual period for 12 weeks after delivery. °· You have stopped breastfeeding and you have not had a menstrual period for 12 weeks after you stopped breastfeeding. °· You have questions about caring for yourself or your baby. °· You pass a blood clot from your vagina. °Get help right away if: °· You have chest pain. °· You have difficulty breathing. °· You have sudden, severe leg pain. °· You have severe pain or cramping in your lower abdomen. °· You bleed from your vagina so much that you fill more than one sanitary pad in one hour. Bleeding should not be heavier than your heaviest period. °· You develop a severe headache. °· You faint. °· You have blurred vision or spots in your vision. °· You have bad-smelling vaginal discharge. °· You have thoughts about hurting yourself or your baby. °If you ever feel like you may hurt yourself or others, or have thoughts about taking your own life, get help   right away. You can go to the nearest emergency department or call:  Your local emergency services (911 in the [place]  A suicide crisis helpline, such as the National Suicide Prevention Lifeline at 612-511-8016. This is open 24 hours a day. Summary  The period of time right after you deliver your newborn up to 6-12 weeks after delivery is called the postpartum period.  Gradually return to your normal activities as told by your health care provider.  Keep all follow-up visits for you and your baby as told by your health care provider. This information is not intended to replace advice given to you by your health care provider. Make sure you discuss any questions you have with your health care provider. Document Released: 09/12/2007 Document Revised: 11/18/2017 Document Reviewed: 08/29/2017 Elsevier Patient Education  2020 Elsevier Inc.    Colocacin de un dispositivo intrauterino Intrauterine Device Insertion Un dispositivo intrauterino (DIU) es un dispositivo  mdico que se coloca en el tero para impedir Firefighter. Es un dispositivo pequeo en forma de T del que cuelgan uno o dos hilos de [place]. Los hilos cuelgan de la parte inferior del tero (cuello uterino) para que el DIU pueda extraerse en el futuro. Hay dos tipos de DIU:  DIU de cobre. Este tipo de DIU est envuelto por un alambre de cobre. El cobre hace que el tero y las trompas de Falopio produzcan un lquido que [employer] espermatozoides. Un DIU de cobre puede durar hasta 10aos.  DIU hormonal. Este tipo de DIU est hecho de plstico y contiene la hormona progestina (progesterona sinttica). Esta hormona hace que la mucosidad del cuello uterino se haga ms espesa y evita que el esperma ingrese en el tero. Tambin hace que el endometrio sea ms delgado, lo que impide la implantacin del vulo fertilizado. La hormona debilita o destruye los espermatozoides que ingresan al tero. Un DIU hormonal puede durar entre 3 y 22aos. Informe al mdico acerca de lo siguiente:  Cualquier alergia que tenga.  Todos los [employer], incluidos vitaminas, hierbas, gotas oftlmicas, cremas y [address] de [address].  Cualquier problema que usted o sus familiares hayan tenido con anestsicos.  Cualquier enfermedad de la sangre que tenga.  Cirugas a las que se someti.  Todas las afecciones que tenga, incluidas las ITS (infecciones de transmisin sexual) que Production designer, theatre/television/film.  Si est embarazada o podra estarlo. Cules son los riesgos? En general, se trata de un procedimiento seguro. Sin embargo, pueden ocurrir complicaciones, por ejemplo:  Infeccin.  Hemorragia.  Reacciones alrgicas a los medicamentos.  Puncin accidental (perforacin) en el tero o dao a otras estructuras u otros rganos.  La colocacin accidental del DIU en la capa muscular del tero (miometrio) o fuera del tero.  Que el DIU se salga del tero (expulsin). Esto es ms frecuente en las mujeres que han  dado a luz recientemente.  Un embarazo que ocurre en las trompas de Falopio (embarazo ectpico).  Infeccin del tero y de las trompas de Falopio (enfermedad plvica inflamatoria). Qu ocurre antes del procedimiento?  Programe la colocacin del DIU para cuando tenga el perodo menstrual o inmediatamente despus para asegurarse de que no est embarazada. La colocacin del DIU se tolera mejor poco despus del ciclo menstrual.  Siga las indicaciones del mdico respecto de las restricciones de comidas o bebidas.  Consulte a su mdico si debe cambiar o suspender los medicamentos que toma habitualmente. Esto es muy importante si toma medicamentos para la diabetes o anticoagulantes.  Gretchen Short  le indiquen que tome un analgsico antes del procedimiento.  Es posible que deba hacerse estudios de lo siguiente: ? Media planner. Ardelia Mems prueba de Engineer, manufacturing una muestra de [place]. ? ITS. Colocar un DIU a una mujer que tiene una ITS puede empeorar la afeccin. ? Cncer de cuello uterino. Pueden realizarle una prueba de Papanicolaou para descartar este tipo de cncer. Para ello, se toman clulas del cuello uterino, que luego se examinan con un microscopio.  Es posible que Electrical engineer un examen fsico para Chief Financial Officer y la posicin del tero. Este procedimiento puede variar segn el mdico y el hospital. Sander Nephew ocurre durante el procedimiento?  Le colocarn un instrumento (espculo) en la vagina, y este se ensanchar para que el mdico pueda ver el cuello uterino.  Es posible que le coloquen medicamentos en el cuello uterino para disminuir el riesgo de contraer una infeccin (antisptico).  Tal vez le administren un anestsico para adormecer cada lado del cuello uterino (bloqueo intracervical o bloqueo paracervical). Este medicamento suele administrarse mediante una inyeccin en el cuello uterino.  Se introducir un instrumento (sonda uterina) en el tero para determinar la longitud del  tero y la direccin hacia la cual el tero podra estar inclinado.  Se introducir un instrumento o un tubo delgado (insertador de DIU) que sostiene el DIU en la vagina, a travs del conducto endocervical, hasta el tero.  El DIU se colocar en el tero, y el insertador de DIU se Regulatory affairs officer.  Los hilos unidos al DIU se cortarn para que queden justo debajo del cuello uterino. Este procedimiento puede variar segn el mdico y el hospital. Sander Nephew ocurre despus del procedimiento?  Podr tener sangrado despus del procedimiento. Esto es normal. Vara desde un goteo ligero (manchado) durante un par de [employer] un sangrado similar al de la [place].  Puede sentir clicos y Social research officer, government.  Puede sentirse mareada o aturdida.  Puede sentir dolor en la parte inferior de la espalda. Resumen  Un dispositivo intrauterino (DIU) es un dispositivo pequeo en forma de T del que cuelgan uno o dos hilos de [place].  Hay dos tipos de DIU. Pueden colocarle un DIU de cobre o un DIU hormonal.  Programe la colocacin del DIU para cuando tenga el perodo menstrual o inmediatamente despus para asegurarse de que no est embarazada. La colocacin del DIU se tolera mejor poco despus del ciclo menstrual.  Podr tener sangrado despus del procedimiento. Esto es normal. Vara desde un goteo ligero durante un par de [employer] un sangrado similar al menstrual. Esta informacin no tiene Marine scientist el consejo del mdico. Asegrese de hacerle al mdico cualquier pregunta que tenga. Document Released: 08/09/2012 Document Revised: 08/18/2017 Document Reviewed: 08/18/2017 Elsevier Patient Education  2020 [employer].

## 2019-09-27 ENCOUNTER — Ambulatory Visit: Payer: Self-pay

## 2019-10-18 ENCOUNTER — Ambulatory Visit: Payer: Self-pay | Admitting: Obstetrics and Gynecology

## 2019-10-18 ENCOUNTER — Other Ambulatory Visit: Payer: Self-pay

## 2019-10-18 NOTE — Progress Notes (Signed)
@  340pm LVM with [place] interpreter for the patient. For the patient to call the office back for her appointment.  @350pm  LVM With interpreter to call the office back to reschedule her appointment that she's missed today.

## 2019-10-18 NOTE — Progress Notes (Signed)
Patient did not keep her appointment today.    Lezlie Lye, NP 10/18/2019 4:25 PM

## 2020-01-15 ENCOUNTER — Other Ambulatory Visit: Payer: Self-pay

## 2020-01-15 ENCOUNTER — Emergency Department (HOSPITAL_COMMUNITY): Payer: Self-pay

## 2020-01-15 ENCOUNTER — Telehealth: Payer: Self-pay | Admitting: Surgery

## 2020-01-15 ENCOUNTER — Emergency Department (HOSPITAL_COMMUNITY)
Admission: EM | Admit: 2020-01-15 | Discharge: 2020-01-15 | Disposition: A | Discharge status: Home / Self Care | Payer: Self-pay | Attending: Emergency Medicine | Admitting: Emergency Medicine

## 2020-01-15 ENCOUNTER — Encounter (HOSPITAL_COMMUNITY): Payer: Self-pay | Admitting: Radiology

## 2020-01-15 DIAGNOSIS — Y999 Unspecified external cause status: Secondary | ICD-10-CM | POA: Insufficient documentation

## 2020-01-15 DIAGNOSIS — Y929 Unspecified place or not applicable: Secondary | ICD-10-CM | POA: Insufficient documentation

## 2020-01-15 DIAGNOSIS — T148XXA Other injury of unspecified body region, initial encounter: Secondary | ICD-10-CM

## 2020-01-15 DIAGNOSIS — S40022A Contusion of left upper arm, initial encounter: Secondary | ICD-10-CM | POA: Insufficient documentation

## 2020-01-15 DIAGNOSIS — Y939 Activity, unspecified: Secondary | ICD-10-CM | POA: Insufficient documentation

## 2020-01-15 DIAGNOSIS — R1012 Left upper quadrant pain: Secondary | ICD-10-CM | POA: Insufficient documentation

## 2020-01-15 DIAGNOSIS — S20471A Other superficial bite of right back wall of thorax, initial encounter: Secondary | ICD-10-CM | POA: Insufficient documentation

## 2020-01-15 LAB — POC URINE PREG, ED: Preg Test, Ur: NEGATIVE

## 2020-01-15 LAB — URINALYSIS, ROUTINE W REFLEX MICROSCOPIC
Bilirubin Urine: NEGATIVE
Glucose, UA: NEGATIVE mg/dL
Hgb urine dipstick: NEGATIVE
Ketones, ur: 5 mg/dL — AB
Nitrite: NEGATIVE
Protein, ur: NEGATIVE mg/dL
Specific Gravity, Urine: 1.025 (ref 1.005–1.030)
pH: 5 (ref 5.0–8.0)

## 2020-01-15 LAB — BASIC METABOLIC PANEL
Anion gap: 13 (ref 5–15)
BUN: 9 mg/dL (ref 6–20)
CO2: 21 mmol/L — ABNORMAL LOW (ref 22–32)
Calcium: 9.3 mg/dL (ref 8.9–10.3)
Chloride: 105 mmol/L (ref 98–111)
Creatinine, Ser: 0.57 mg/dL (ref 0.44–1.00)
GFR calc Af Amer: >60 mL/min (ref >60)
GFR calc non Af Amer: >60 mL/min (ref >60)
Glucose, Bld: 139 mg/dL — ABNORMAL HIGH (ref 70–99)
Potassium: 3.3 mmol/L — ABNORMAL LOW (ref 3.5–5.1)
Sodium: 139 mmol/L (ref 135–145)

## 2020-01-15 LAB — CBC WITH DIFFERENTIAL/PLATELET
Abs Immature Granulocytes: 0.02 10*3/uL (ref 0.00–0.07)
Basophils Absolute: 0.1 10*3/uL (ref 0.0–0.1)
Basophils Relative: 1 %
Eosinophils Absolute: 0.1 10*3/uL (ref 0.0–0.5)
Eosinophils Relative: 1 %
HCT: 39.4 % (ref 36.0–46.0)
Hemoglobin: 13.1 g/dL (ref 12.0–15.0)
Immature Granulocytes: 0 %
Lymphocytes Relative: 20 %
Lymphs Abs: 2 10*3/uL (ref 0.7–4.0)
MCH: 29.6 pg (ref 26.0–34.0)
MCHC: 33.2 g/dL (ref 30.0–36.0)
MCV: 88.9 fL (ref 80.0–100.0)
Monocytes Absolute: 0.6 10*3/uL (ref 0.1–1.0)
Monocytes Relative: 6 %
Neutro Abs: 7.1 10*3/uL (ref 1.7–7.7)
Neutrophils Relative %: 72 %
Platelets: 353 10*3/uL (ref 150–400)
RBC: 4.43 MIL/uL (ref 3.87–5.11)
RDW: 11.7 % (ref 11.5–15.5)
WBC: 9.8 10*3/uL (ref 4.0–10.5)
nRBC: 0 % (ref 0.0–0.2)

## 2020-01-15 LAB — PREGNANCY, URINE: Preg Test, Ur: NEGATIVE

## 2020-01-15 MED ORDER — IOHEXOL 300 MG/ML  SOLN
80.0000 mL | Freq: Once | INTRAMUSCULAR | Status: AC | PRN
  Administered 2020-01-15: 75 mL via INTRAVENOUS

## 2020-01-15 MED ORDER — AMOXICILLIN-POT CLAVULANATE 875-125 MG PO TABS: 1.0000 | ORAL_TABLET | Freq: Two times a day (BID) | ORAL | 0 refills | Status: AC

## 2020-01-15 MED ORDER — AMOXICILLIN-POT CLAVULANATE 875-125 MG PO TABS
1.0000 | ORAL_TABLET | Freq: Once | ORAL | Status: AC
  Administered 2020-01-15: 1 via ORAL
  Filled 2020-01-15: qty 1
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
## 2020-01-17 LAB — URINE CULTURE
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
## 2021-01-31 ENCOUNTER — Other Ambulatory Visit: Payer: Self-pay

## 2021-01-31 ENCOUNTER — Ambulatory Visit (INDEPENDENT_AMBULATORY_CARE_PROVIDER_SITE_OTHER): Payer: Self-pay

## 2021-01-31 DIAGNOSIS — Z23 Encounter for immunization: Secondary | ICD-10-CM

## 2021-01-31 NOTE — Progress Notes (Signed)
   Covid-19 Vaccination Clinic  Name:  Dawn [name]    MRN: [id number] DOB: 1981-10-02  01/31/2021  Dawn [name] was observed post Covid-19 immunization for 15 minutes without incident. She was provided with Vaccine Information Sheet and instruction to access the V-Safe system.   Dawn [name] was instructed to call 911 with any severe reactions post vaccine: Marland Kitchen Difficulty breathing  . Swelling of face and throat  . A fast heartbeat  . A bad rash all over body  . Dizziness and weakness   Immunizations Administered    Name Date Dose VIS Date Route   PFIZER Comrnaty(Gray TOP) Covid-19 Vaccine 01/31/2021  9:43 AM 0.3 mL 11/06/2020 Intramuscular   Manufacturer: [employer], [employer]   Lot: [id number]   NDC: [phone]

## 2021-02-21 ENCOUNTER — Ambulatory Visit: Payer: Self-pay

## 2021-09-24 LAB — OB RESULTS CONSOLE GC/CHLAMYDIA
Chlamydia: NEGATIVE
Gonorrhea: NEGATIVE

## 2021-09-24 LAB — OB RESULTS CONSOLE HIV ANTIBODY (ROUTINE TESTING): HIV: NONREACTIVE

## 2021-09-24 LAB — OB RESULTS CONSOLE HEPATITIS B SURFACE ANTIGEN: Hepatitis B Surface Ag: NEGATIVE

## 2021-09-24 LAB — OB RESULTS CONSOLE RPR: RPR: NONREACTIVE

## 2021-09-24 LAB — OB RESULTS CONSOLE RUBELLA ANTIBODY, IGM: Rubella: IMMUNE

## 2021-09-24 LAB — HEPATITIS C ANTIBODY: HCV Ab: NEGATIVE

## 2021-09-29 ENCOUNTER — Telehealth: Payer: Self-pay

## 2021-09-29 NOTE — Telephone Encounter (Signed)
Left message for Dawn [name] at [facility] that we need Prenatal Records for patient prior to 11/4 (the day of the patients appointment)

## 2021-09-30 ENCOUNTER — Other Ambulatory Visit: Payer: Self-pay | Admitting: Nurse Practitioner

## 2021-09-30 ENCOUNTER — Encounter: Payer: Self-pay | Admitting: *Deleted

## 2021-09-30 DIAGNOSIS — Z363 Encounter for antenatal screening for malformations: Secondary | ICD-10-CM

## 2021-10-02 ENCOUNTER — Other Ambulatory Visit: Payer: Self-pay | Admitting: *Deleted

## 2021-10-02 ENCOUNTER — Other Ambulatory Visit: Payer: Self-pay

## 2021-10-02 ENCOUNTER — Encounter: Payer: Self-pay | Admitting: *Deleted

## 2021-10-02 ENCOUNTER — Ambulatory Visit: Payer: Self-pay | Admitting: *Deleted

## 2021-10-02 ENCOUNTER — Ambulatory Visit: Payer: Self-pay | Attending: Nurse Practitioner

## 2021-10-02 VITALS — BP 109/83 | HR 82

## 2021-10-02 DIAGNOSIS — Z363 Encounter for antenatal screening for malformations: Secondary | ICD-10-CM | POA: Insufficient documentation

## 2021-10-02 DIAGNOSIS — O09523 Supervision of elderly multigravida, third trimester: Secondary | ICD-10-CM

## 2021-10-02 DIAGNOSIS — O09293 Supervision of pregnancy with other poor reproductive or obstetric history, third trimester: Secondary | ICD-10-CM

## 2021-10-15 LAB — OB RESULTS CONSOLE GBS: GBS: NEGATIVE

## 2021-10-27 ENCOUNTER — Ambulatory Visit: Payer: Self-pay

## 2021-10-27 ENCOUNTER — Ambulatory Visit: Payer: Self-pay | Attending: Maternal & Fetal Medicine

## 2021-10-29 ENCOUNTER — Inpatient Hospital Stay (EMERGENCY_DEPARTMENT_HOSPITAL)
Admission: AD | Admit: 2021-10-29 | Discharge: 2021-10-29 | Disposition: A | Discharge status: Home / Self Care | Payer: Medicaid Other | Source: Home / Self Care | Attending: Obstetrics and Gynecology | Admitting: Obstetrics and Gynecology

## 2021-10-29 ENCOUNTER — Encounter (HOSPITAL_COMMUNITY): Payer: Self-pay | Admitting: Obstetrics and Gynecology

## 2021-10-29 DIAGNOSIS — O09523 Supervision of elderly multigravida, third trimester: Secondary | ICD-10-CM | POA: Insufficient documentation

## 2021-10-29 DIAGNOSIS — Z20822 Contact with and (suspected) exposure to covid-19: Secondary | ICD-10-CM | POA: Insufficient documentation

## 2021-10-29 DIAGNOSIS — O09293 Supervision of pregnancy with other poor reproductive or obstetric history, third trimester: Secondary | ICD-10-CM | POA: Insufficient documentation

## 2021-10-29 DIAGNOSIS — R519 Headache, unspecified: Secondary | ICD-10-CM | POA: Insufficient documentation

## 2021-10-29 DIAGNOSIS — R030 Elevated blood-pressure reading, without diagnosis of hypertension: Secondary | ICD-10-CM | POA: Insufficient documentation

## 2021-10-29 DIAGNOSIS — Z3A38 38 weeks gestation of pregnancy: Secondary | ICD-10-CM

## 2021-10-29 DIAGNOSIS — O26893 Other specified pregnancy related conditions, third trimester: Secondary | ICD-10-CM | POA: Insufficient documentation

## 2021-10-29 LAB — TYPE AND SCREEN
ABO/RH(D): O POS
Antibody Screen: NEGATIVE

## 2021-10-29 LAB — RESP PANEL BY RT-PCR (FLU A&B, COVID) ARPGX2
Influenza A by PCR: NEGATIVE
Influenza B by PCR: NEGATIVE
SARS Coronavirus 2 by RT PCR: NEGATIVE

## 2021-10-29 LAB — PROTEIN / CREATININE RATIO, URINE
Creatinine, Urine: 96.28 mg/dL
Protein Creatinine Ratio: 0.18 mg/mg{Cre} — ABNORMAL HIGH (ref 0.00–0.15)
Total Protein, Urine: 17 mg/dL

## 2021-10-29 LAB — COMPREHENSIVE METABOLIC PANEL
ALT: 13 U/L (ref 0–44)
AST: 21 U/L (ref 15–41)
Albumin: 2.9 g/dL — ABNORMAL LOW (ref 3.5–5.0)
Alkaline Phosphatase: 159 U/L — ABNORMAL HIGH (ref 38–126)
Anion gap: 8 (ref 5–15)
BUN: 8 mg/dL (ref 6–20)
CO2: 20 mmol/L — ABNORMAL LOW (ref 22–32)
Calcium: 8.6 mg/dL — ABNORMAL LOW (ref 8.9–10.3)
Chloride: 107 mmol/L (ref 98–111)
Creatinine, Ser: 0.5 mg/dL (ref 0.44–1.00)
GFR, Estimated: >60 mL/min (ref >60)
Glucose, Bld: 76 mg/dL (ref 70–99)
Potassium: 3.5 mmol/L (ref 3.5–5.1)
Sodium: 135 mmol/L (ref 135–145)
Total Bilirubin: 0.5 mg/dL (ref 0.3–1.2)
Total Protein: 6.3 g/dL — ABNORMAL LOW (ref 6.5–8.1)

## 2021-10-29 LAB — CBC
HCT: 37.6 % (ref 36.0–46.0)
Hemoglobin: 12.7 g/dL (ref 12.0–15.0)
MCH: 29.2 pg (ref 26.0–34.0)
MCHC: 33.8 g/dL (ref 30.0–36.0)
MCV: 86.4 fL (ref 80.0–100.0)
Platelets: 185 10*3/uL (ref 150–400)
RBC: 4.35 MIL/uL (ref 3.87–5.11)
RDW: 13.7 % (ref 11.5–15.5)
WBC: 9.4 10*3/uL (ref 4.0–10.5)
nRBC: 0 % (ref 0.0–0.2)

## 2021-10-29 NOTE — MAU Note (Signed)
.  Dawn [name] is a 40 y.o. at [redacted]w[redacted]d here in MAU reporting: sent from office for elevated BP and headache. Denies visual disturbances or epigastric pain. Endorses good fetal movement. Denies VB or LOF.   Pain score: 2  FHT:[id number]

## 2021-10-29 NOTE — MAU Provider Note (Signed)
Chief Complaint:  Headache and Hypertension     HPI: Dawn [name] is a 40 y.o. [id number] at [redacted]w[redacted]d by LMP who was sent to maternity admissions by [facility] due to elevated BP and headache. Pt reports headache began before her [facility] prenatal appt this morning. She rates the pain as 2/10. She states she has not had any other headaches recently. Before today She reports good fetal movement, denies LOF, vaginal bleeding, vaginal itching/burning, urinary symptoms, h/a, dizziness, n/v, or fever/chills.      HPI  Past Medical History: Past Medical History:  Diagnosis Date   Language barrier    Hospital Interpreter Eda Royal assisted with pat history   No pertinent past medical history     Past obstetric history: OB History  Gravida Para Term Preterm AB Living  SAB IAB Ectopic Multiple Live Births  1     0 5    # Outcome Date GA Lbr Len/2nd Weight Sex Delivery Anes PTL Lv  7 Current           6 Term 09/19/19 [redacted]w[redacted]d / 00:06 3070 g F VBAC None  LIV  5 Term 01/20/17 [redacted]w[redacted]d 12:45 / 00:19 3475 g F VBAC None  LIV  4 SAB 01/12/16          3 Term 02/01/12 [redacted]w[redacted]d  3355 g M CS-LTranv   LIV     Birth Comments: c/s due to breech  2 Term 09/10/08     Vag-Spont   LIV  1 Term 07/13/05     Vag-Spont   LIV    Past Surgical History: Past Surgical History:  Procedure Laterality Date   CESAREAN SECTION  02/01/2012   Procedure: CESAREAN SECTION;  Surgeon: Scheryl Darter, MD;  Location: [facility];  Service: Gynecology;  Laterality: N/A;  ultrasound prior c/s   DILATION AND EVACUATION N/A 01/12/2016   Procedure: DILATATION AND EVACUATION;  Surgeon: Levie Heritage, DO;  Location: [facility];  Service: Gynecology;  Laterality: N/A;  missed AB 12-14 weeks    DILATION AND EVACUATION N/A 01/15/2016   Procedure: DILATATION AND EVACUATION;  Surgeon: Adam Phenix, MD;  Location: [facility];  Service: Gynecology;  Laterality: N/A;   NO PAST SURGERIES     VAGINAL DELIVERY  x2    Family History: Family History   Problem Relation Age of Onset   Cancer Mother     Social History: Social History   Tobacco Use   Smoking status: Never   Smokeless tobacco: Never  Vaping Use   Vaping Use: Never used  Substance Use Topics   Alcohol use: No   Drug use: No    Allergies: No Known Allergies  Meds:  Medications Prior to Admission  Medication Sig Dispense Refill Last Dose   Prenatal Vit-Fe Fumarate-FA (PRENATAL MULTIVITAMIN) TABS Take 1 tablet by mouth daily.   10/28/2021   acetaminophen (TYLENOL) 325 MG tablet Take 2 tablets (650 mg total) by mouth every 4 (four) hours as needed (for pain scale < 4). 30 tablet 0     ROS:  Review of Systems   I have reviewed patient's Past Medical Hx, Surgical Hx, Family Hx, Social Hx, medications and allergies.   Physical Exam  Patient Vitals for the past 24 hrs:  BP Temp Temp src Pulse Resp SpO2  10/29/21 1401 (!) 136/96 -- -- 85 -- 98 %  10/29/21 1346 122/90 -- -- 85 -- --  10/29/21 1331 119/85 -- -- 80 -- --  10/29/21 1316 126/80 -- -- 75 -- --  10/29/21 1301 115/83 -- -- 79 -- --  10/29/21 1246 120/81 -- -- 75 -- --  10/29/21 1231 115/79 -- -- 84 -- --  10/29/21 1216 125/81 -- -- 89 -- --  10/29/21 1159 (!) 130/91 98.8 F (37.1 C) Oral 81 14 100 %   Constitutional: Well-developed, well-nourished female in no acute distress.  Cardiovascular: normal rate Respiratory: normal effort GI: Abd soft, non-tender, gravid appropriate for gestational age.  MS: Extremities nontender, no edema, normal ROM Neurologic: Alert and oriented x 4.  GU: Neg CVAT.   FHT:  Baseline 130 , moderate variability, accelerations present, no decelerations Contractions: irregular    Labs: Results for orders placed or performed during the hospital encounter of 10/29/21 (from the past 24 hour(s))  CBC     Status: None   Collection Time: 10/29/21 12:38 PM  Result Value Ref Range   WBC 9.4 4.0 - 10.5 K/uL   RBC 4.35 3.87 - 5.11 MIL/uL   Hemoglobin 12.7 12.0 - 15.0 g/dL    HCT 16.1 09.6 - 04.5 %   MCV 86.4 80.0 - 100.0 fL   MCH 29.2 26.0 - 34.0 pg   MCHC 33.8 30.0 - 36.0 g/dL   RDW 40.9 81.1 - 91.4 %   Platelets 185 150 - 400 K/uL   nRBC 0.0 0.0 - 0.2 %  Comprehensive metabolic panel     Status: Abnormal   Collection Time: 10/29/21 12:38 PM  Result Value Ref Range   Sodium 135 135 - 145 mmol/L   Potassium 3.5 3.5 - 5.1 mmol/L   Chloride 107 98 - 111 mmol/L   CO2 20 (L) 22 - 32 mmol/L   Glucose, Bld 76 70 - 99 mg/dL   BUN 8 6 - 20 mg/dL   Creatinine, Ser 7.82 0.44 - 1.00 mg/dL   Calcium 8.6 (L) 8.9 - 10.3 mg/dL   Total Protein 6.3 (L) 6.5 - 8.1 g/dL   Albumin 2.9 (L) 3.5 - 5.0 g/dL   AST 21 15 - 41 U/L   ALT 13 0 - 44 U/L   Alkaline Phosphatase 159 (H) 38 - 126 U/L   Total Bilirubin 0.5 0.3 - 1.2 mg/dL   GFR, Estimated >95 >62 mL/min   Anion gap 8 5 - 15  Protein / creatinine ratio, urine     Status: Abnormal   Collection Time: 10/29/21 12:42 PM  Result Value Ref Range   Creatinine, Urine 96.28 mg/dL   Total Protein, Urine 17 mg/dL   Protein Creatinine Ratio 0.18 (H) 0.00 - 0.15 mg/mg[Cre]      Imaging:  [place] MFM OB DETAIL +14 WK  Result Date: 10/02/2021 ----------------------------------------------------------------------  OBSTETRICS REPORT                       (Signed Final 10/02/2021 12:07 pm) ---------------------------------------------------------------------- Patient Info  ID #:       [id number]                          D.O.B.:  Oct 01, 1981 (40 yrs)  Name:       Dawn [name]            Visit Date: 10/02/2021 10:49 am ---------------------------------------------------------------------- Performed By  Attending:        Lin Landsman      Ref. Address:     [address]  [place]                                                             [place] Mires                                                             [phone]  Performed By:     Reinaldo Raddle             [facility]  Referred By:      Alberteen Spindle NP ---------------------------------------------------------------------- Orders  #  Description                           Code        Ordered By  1  [place] MFM OB DETAIL +14 WK               57017.79    Orlene Erm ----------------------------------------------------------------------  #  Order #                     Accession #                Episode #  1  [id number]                   [id number]                 [id number] ---------------------------------------------------------------------- Indications  Encounter for antenatal screening for  Z36.3  malformations  Advanced maternal age multigravida 14+,        O50.523  third trimester (40 yo)  Late to prenatal care, third trimester         O09.33  Declined genetic testing  History of cesarean delivery, currently        O1.219  pregnant (x1, VBAC x 2)  Poor obstetric history: Previous               O09.299  preeclampsia / eclampsia/gestational HTN  [redacted] weeks gestation of pregnancy                Z3A.34 ---------------------------------------------------------------------- Fetal Evaluation  Num Of Fetuses:         1  Fetal Heart Rate(bpm):  140  Cardiac Activity:       Observed  Presentation:           Breech  Placenta:               Anterior  P. Cord Insertion:      Visualized, central  Amniotic Fluid  AFI FV:      Within normal limits  AFI Sum(cm)     %Tile       Largest Pocket(cm)  19.82           74          7.07  RUQ(cm)       RLQ(cm)       LUQ(cm)        LLQ(cm)  7.07          3.02          5.43           4.3 ---------------------------------------------------------------------- Biometry  BPD:      81.2  mm     G. Age:  32w 4d        4.5  %    CI:         63.86   %    70 - 86                                                          FL/HC:      18.7   %    20.1 - 22.3  HC:      327.1  mm     G. Age:  37w 1d         73  %    HC/AC:      1.07        0.93 - 1.11  AC:      305.3  mm     G. Age:  34w 3d         45  %    FL/BPD:     75.4   %    71 - 87  FL:       61.2  mm     G. Age:  31w 5d        < 1  %    FL/AC:      20.0   %    20 - 24  CER:      46.2  mm     G. Age:  35w 3d         44  %  LV:  1.8  mm  CM:        7.7  mm  Est. FW:    2282  gm           5 lb     19  % ---------------------------------------------------------------------- Gestational Age  LMP:           34w 6d        Date:  01/31/21                 EDD:   11/07/21  U/S Today:     34w 0d                                        EDD:   11/13/21  Best:          34w 6d     Det. By:  LMP  (01/31/21)          EDD:   11/07/21 ---------------------------------------------------------------------- Anatomy  Cranium:               Appears normal         LVOT:                   Not well visualized  Cavum:                 Appears normal         Aortic Arch:            Appears normal  Ventricles:            Appears normal         Ductal Arch:            Appears normal  Choroid Plexus:        Appears normal         Diaphragm:              Appears normal  Cerebellum:            Appears normal         Stomach:                Appears normal, left                                                                        sided  Posterior Fossa:       Appears normal         Abdomen:                Appears normal  Nuchal Fold:           Appears normal         Abdominal Wall:         Not well visualized  Face:                  Orbits nl; profile     Cord Vessels:           Appears normal (3                         limited  vessel cord)  Lips:                  Appears normal         Kidneys:                Appear normal  Palate:                Not well visualized    Bladder:                 Appears normal  Thoracic:              Appears normal         Spine:                  Appears normal  Heart:                 Not well visualized    Upper Extremities:      Visualized  RVOT:                  Not well visualized    Lower Extremities:      Visualized  Other:  Technically difficult due to advanced gestational age. Fetus appears          to be a female. ---------------------------------------------------------------------- Cervix Uterus Adnexa  Cervix  Not visualized (advanced GA >24wks)  Uterus  No abnormality visualized.  Right Ovary  Not visualized.  Left Ovary  Not visualized.  Cul De Sac  No free fluid seen.  Adnexa  No adnexal mass visualized. ---------------------------------------------------------------------- Impression  Single intrauterine pregnancy here for a detailed anatomy  due to late to prenatal care.  Normal anatomy with measurements consistent with dates  There is good fetal movement and amniotic fluid volume  Suboptimal views of the fetal anatomy were obtained  secondary to fetal position and advanced gestational age.  She declined genetic testing. ---------------------------------------------------------------------- Recommendations  Follow up growth and anatomy in 3-4 weeks. ----------------------------------------------------------------------               Lin Landsman, MD Electronically Signed Final Report   10/02/2021 12:07 pm ----------------------------------------------------------------------   MAU Course/MDM: Orders Placed This Encounter  Procedures   Resp Panel by RT-PCR (Flu A&B, Covid) Nasopharyngeal Swab   CBC   Comprehensive metabolic panel   Protein / creatinine ratio, urine   RPR   Type and screen [facility]     No orders of the defined types were placed in this encounter.     Pt discharge with strict return precautions.    Assessment:  1) Elevated BP  -BP 130/91 @1159 .  -All subsequent BP measurements within normal  limits -CBC, CMP, and P.C ratio normal.   2) Headache -pt reported headache severity of 2/10 upon arrival to MAU. Headache spontaneously resolved while in MAU.   Plan: Discharge home Labor precautions and fetal kick counts    PA-S 10/29/2021 2:08 PM

## 2021-10-30 ENCOUNTER — Other Ambulatory Visit: Payer: Self-pay

## 2021-10-30 ENCOUNTER — Inpatient Hospital Stay (HOSPITAL_COMMUNITY)
Admission: AD | Admit: 2021-10-30 | Discharge: 2021-11-01 | DRG: 785 | Disposition: A | Discharge status: Home / Self Care | Payer: Medicaid Other | Attending: Family Medicine | Admitting: Family Medicine

## 2021-10-30 ENCOUNTER — Other Ambulatory Visit: Payer: Self-pay | Admitting: Advanced Practice Midwife

## 2021-10-30 ENCOUNTER — Inpatient Hospital Stay (HOSPITAL_COMMUNITY): Payer: Medicaid Other | Admitting: Anesthesiology

## 2021-10-30 ENCOUNTER — Encounter (HOSPITAL_COMMUNITY): Payer: Self-pay | Admitting: Obstetrics and Gynecology

## 2021-10-30 ENCOUNTER — Encounter (HOSPITAL_COMMUNITY)
Admission: AD | Disposition: A | Discharge status: Home / Self Care | Payer: Self-pay | Source: Home / Self Care | Attending: Family Medicine

## 2021-10-30 DIAGNOSIS — Z23 Encounter for immunization: Secondary | ICD-10-CM

## 2021-10-30 DIAGNOSIS — D509 Iron deficiency anemia, unspecified: Secondary | ICD-10-CM | POA: Diagnosis present

## 2021-10-30 DIAGNOSIS — O34211 Maternal care for low transverse scar from previous cesarean delivery: Principal | ICD-10-CM | POA: Diagnosis present

## 2021-10-30 DIAGNOSIS — O09529 Supervision of elderly multigravida, unspecified trimester: Secondary | ICD-10-CM

## 2021-10-30 DIAGNOSIS — O9902 Anemia complicating childbirth: Secondary | ICD-10-CM | POA: Diagnosis present

## 2021-10-30 DIAGNOSIS — Z3480 Encounter for supervision of other normal pregnancy, unspecified trimester: Secondary | ICD-10-CM

## 2021-10-30 DIAGNOSIS — Z20822 Contact with and (suspected) exposure to covid-19: Secondary | ICD-10-CM | POA: Diagnosis present

## 2021-10-30 DIAGNOSIS — Z302 Encounter for sterilization: Secondary | ICD-10-CM | POA: Diagnosis not present

## 2021-10-30 DIAGNOSIS — Z3A38 38 weeks gestation of pregnancy: Secondary | ICD-10-CM

## 2021-10-30 DIAGNOSIS — Z98891 History of uterine scar from previous surgery: Secondary | ICD-10-CM

## 2021-10-30 DIAGNOSIS — Z9079 Acquired absence of other genital organ(s): Secondary | ICD-10-CM

## 2021-10-30 LAB — PROTEIN / CREATININE RATIO, URINE
Creatinine, Urine: 45.33 mg/dL
Protein Creatinine Ratio: 0.2 mg/mg{Cre} — ABNORMAL HIGH (ref 0.00–0.15)
Total Protein, Urine: 9 mg/dL

## 2021-10-30 LAB — RPR: RPR Ser Ql: NONREACTIVE

## 2021-10-30 SURGERY — Surgical Case
Anesthesia: Spinal

## 2021-10-30 MED ORDER — METHYLERGONOVINE MALEATE 0.2 MG/ML IJ SOLN
INTRAMUSCULAR | Status: AC
  Filled 2021-10-30: qty 1

## 2021-10-30 MED ORDER — SCOPOLAMINE 1 MG/3DAYS TD PT72
MEDICATED_PATCH | TRANSDERMAL | Status: DC | PRN
  Administered 2021-10-30: 1 via TRANSDERMAL

## 2021-10-30 MED ORDER — BUPIVACAINE IN DEXTROSE 0.75-8.25 % IT SOLN
INTRATHECAL | Status: DC | PRN
  Administered 2021-10-30: 1.6 mL via INTRATHECAL

## 2021-10-30 MED ORDER — OXYTOCIN-SODIUM CHLORIDE 30-0.9 UT/500ML-% IV SOLN
2.5000 [IU]/h | INTRAVENOUS | Status: AC
  Filled 2021-10-30: qty 500

## 2021-10-30 MED ORDER — FENTANYL CITRATE (PF) 100 MCG/2ML IJ SOLN: 25.0000 ug | INTRAMUSCULAR | Status: DC | PRN

## 2021-10-30 MED ORDER — ACETAMINOPHEN 500 MG PO TABS
1000.0000 mg | ORAL_TABLET | Freq: Four times a day (QID) | ORAL | Status: DC
  Administered 2021-10-30 – 2021-11-01 (×6): 1000 mg via ORAL
  Filled 2021-10-30 (×6): qty 2

## 2021-10-30 MED ORDER — NALBUPHINE HCL 10 MG/ML IJ SOLN: 5.0000 mg | Freq: Once | INTRAMUSCULAR | Status: DC | PRN

## 2021-10-30 MED ORDER — MEPERIDINE HCL 25 MG/ML IJ SOLN: 6.2500 mg | INTRAMUSCULAR | Status: DC | PRN

## 2021-10-30 MED ORDER — INFLUENZA VAC SPLIT QUAD 0.5 ML IM SUSY
0.5000 mL | PREFILLED_SYRINGE | INTRAMUSCULAR | Status: AC
  Administered 2021-10-31: 0.5 mL via INTRAMUSCULAR
  Filled 2021-10-30: qty 0.5

## 2021-10-30 MED ORDER — MENTHOL 3 MG MT LOZG: 1.0000 | LOZENGE | OROMUCOSAL | Status: DC | PRN

## 2021-10-30 MED ORDER — KETOROLAC TROMETHAMINE 30 MG/ML IJ SOLN
30.0000 mg | Freq: Four times a day (QID) | INTRAMUSCULAR | Status: AC | PRN
  Filled 2021-10-30: qty 1

## 2021-10-30 MED ORDER — IBUPROFEN 600 MG PO TABS
600.0000 mg | ORAL_TABLET | Freq: Four times a day (QID) | ORAL | Status: DC
  Administered 2021-10-31 – 2021-11-01 (×6): 600 mg via ORAL
  Filled 2021-10-30 (×7): qty 1

## 2021-10-30 MED ORDER — METOCLOPRAMIDE HCL 5 MG/ML IJ SOLN
INTRAMUSCULAR | Status: AC
  Filled 2021-10-30: qty 2

## 2021-10-30 MED ORDER — FAMOTIDINE IN NACL 20-0.9 MG/50ML-% IV SOLN
20.0000 mg | Freq: Once | INTRAVENOUS | Status: AC
  Administered 2021-10-30: 20 mg via INTRAVENOUS
  Filled 2021-10-30: qty 50

## 2021-10-30 MED ORDER — METHYLPREDNISOLONE SODIUM SUCC 125 MG IJ SOLR
INTRAMUSCULAR | Status: AC
  Filled 2021-10-30: qty 2

## 2021-10-30 MED ORDER — CEFAZOLIN SODIUM-DEXTROSE 2-4 GM/100ML-% IV SOLN
INTRAVENOUS | Status: AC
  Filled 2021-10-30: qty 100

## 2021-10-30 MED ORDER — SOD CITRATE-CITRIC ACID 500-334 MG/5ML PO SOLN
30.0000 mL | Freq: Once | ORAL | Status: AC
  Administered 2021-10-30: 30 mL via ORAL
  Filled 2021-10-30: qty 30

## 2021-10-30 MED ORDER — DIPHENHYDRAMINE HCL 50 MG/ML IJ SOLN: 12.5000 mg | INTRAMUSCULAR | Status: DC | PRN

## 2021-10-30 MED ORDER — ZOLPIDEM TARTRATE 5 MG PO TABS: 5.0000 mg | ORAL_TABLET | Freq: Every evening | ORAL | Status: DC | PRN

## 2021-10-30 MED ORDER — PHENYLEPHRINE HCL-NACL 20-0.9 MG/250ML-% IV SOLN
INTRAVENOUS | Status: AC
  Filled 2021-10-30: qty 250

## 2021-10-30 MED ORDER — NALOXONE HCL 4 MG/10ML IJ SOLN
1.0000 ug/kg/h | INTRAVENOUS | Status: DC | PRN
  Filled 2021-10-30: qty 5

## 2021-10-30 MED ORDER — PHENYLEPHRINE 40 MCG/ML (10ML) SYRINGE FOR IV PUSH (FOR BLOOD PRESSURE SUPPORT)
PREFILLED_SYRINGE | INTRAVENOUS | Status: AC
  Filled 2021-10-30: qty 10

## 2021-10-30 MED ORDER — ONDANSETRON HCL 4 MG/2ML IJ SOLN
INTRAMUSCULAR | Status: AC
  Filled 2021-10-30: qty 2

## 2021-10-30 MED ORDER — ACETAMINOPHEN 10 MG/ML IV SOLN: 1000.0000 mg | Freq: Once | INTRAVENOUS | Status: DC | PRN

## 2021-10-30 MED ORDER — SCOPOLAMINE 1 MG/3DAYS TD PT72
MEDICATED_PATCH | TRANSDERMAL | Status: AC
  Filled 2021-10-30: qty 1

## 2021-10-30 MED ORDER — OXYCODONE HCL 5 MG PO TABS: 5.0000 mg | ORAL_TABLET | Freq: Four times a day (QID) | ORAL | Status: DC | PRN

## 2021-10-30 MED ORDER — ACETAMINOPHEN 10 MG/ML IV SOLN
INTRAVENOUS | Status: AC
  Filled 2021-10-30: qty 100

## 2021-10-30 MED ORDER — NALBUPHINE HCL 10 MG/ML IJ SOLN: 5.0000 mg | INTRAMUSCULAR | Status: DC | PRN

## 2021-10-30 MED ORDER — LACTATED RINGERS IV BOLUS
1000.0000 mL | Freq: Once | INTRAVENOUS | Status: AC
  Administered 2021-10-30: 1000 mL via INTRAVENOUS

## 2021-10-30 MED ORDER — TETANUS-DIPHTH-ACELL PERTUSSIS 5-2.5-18.5 LF-MCG/0.5 IM SUSY: 0.5000 mL | PREFILLED_SYRINGE | Freq: Once | INTRAMUSCULAR | Status: DC

## 2021-10-30 MED ORDER — TRANEXAMIC ACID-NACL 1000-0.7 MG/100ML-% IV SOLN
INTRAVENOUS | Status: AC
  Filled 2021-10-30: qty 100

## 2021-10-30 MED ORDER — SIMETHICONE 80 MG PO CHEW: 80.0000 mg | CHEWABLE_TABLET | ORAL | Status: DC | PRN

## 2021-10-30 MED ORDER — ACETAMINOPHEN 10 MG/ML IV SOLN
INTRAVENOUS | Status: DC | PRN
  Administered 2021-10-30: 1000 mg via INTRAVENOUS

## 2021-10-30 MED ORDER — WITCH HAZEL-GLYCERIN EX PADS: 1.0000 "application " | MEDICATED_PAD | CUTANEOUS | Status: DC | PRN

## 2021-10-30 MED ORDER — MORPHINE SULFATE (PF) 0.5 MG/ML IJ SOLN
INTRAMUSCULAR | Status: AC
  Filled 2021-10-30: qty 10

## 2021-10-30 MED ORDER — COCONUT OIL OIL: 1.0000 "application " | TOPICAL_OIL | Status: DC | PRN

## 2021-10-30 MED ORDER — DEXAMETHASONE SODIUM PHOSPHATE 4 MG/ML IJ SOLN
INTRAMUSCULAR | Status: AC
  Filled 2021-10-30: qty 2

## 2021-10-30 MED ORDER — ONDANSETRON HCL 4 MG/2ML IJ SOLN
4.0000 mg | Freq: Three times a day (TID) | INTRAMUSCULAR | Status: DC | PRN
  Administered 2021-10-30: 4 mg via INTRAVENOUS
  Filled 2021-10-30: qty 2

## 2021-10-30 MED ORDER — STERILE WATER FOR IRRIGATION IR SOLN
Status: DC | PRN
  Administered 2021-10-30: 1

## 2021-10-30 MED ORDER — DEXAMETHASONE SODIUM PHOSPHATE 4 MG/ML IJ SOLN
INTRAMUSCULAR | Status: DC | PRN
  Administered 2021-10-30: 8 mg via INTRAVENOUS

## 2021-10-30 MED ORDER — DIPHENHYDRAMINE HCL 25 MG PO CAPS: 25.0000 mg | ORAL_CAPSULE | ORAL | Status: DC | PRN

## 2021-10-30 MED ORDER — ONDANSETRON HCL 4 MG/2ML IJ SOLN
INTRAMUSCULAR | Status: DC | PRN
  Administered 2021-10-30: 4 mg via INTRAVENOUS

## 2021-10-30 MED ORDER — SCOPOLAMINE 1 MG/3DAYS TD PT72: 1.0000 | MEDICATED_PATCH | Freq: Once | TRANSDERMAL | Status: DC

## 2021-10-30 MED ORDER — DIPHENHYDRAMINE HCL 25 MG PO CAPS: 25.0000 mg | ORAL_CAPSULE | Freq: Four times a day (QID) | ORAL | Status: DC | PRN

## 2021-10-30 MED ORDER — METHYLERGONOVINE MALEATE 0.2 MG/ML IJ SOLN
INTRAMUSCULAR | Status: DC | PRN
  Administered 2021-10-30: .2 mg via INTRAMUSCULAR

## 2021-10-30 MED ORDER — ACETAMINOPHEN 500 MG PO TABS: 1000.0000 mg | ORAL_TABLET | Freq: Four times a day (QID) | ORAL | Status: DC

## 2021-10-30 MED ORDER — ENOXAPARIN SODIUM 40 MG/0.4ML IJ SOSY
40.0000 mg | PREFILLED_SYRINGE | INTRAMUSCULAR | Status: DC
  Administered 2021-10-31: 40 mg via SUBCUTANEOUS
  Filled 2021-10-30 (×2): qty 0.4

## 2021-10-30 MED ORDER — METOCLOPRAMIDE HCL 5 MG/ML IJ SOLN
INTRAMUSCULAR | Status: DC | PRN
  Administered 2021-10-30: 10 mg via INTRAVENOUS

## 2021-10-30 MED ORDER — MORPHINE SULFATE (PF) 0.5 MG/ML IJ SOLN
INTRAMUSCULAR | Status: DC | PRN
  Administered 2021-10-30: 150 ug via INTRATHECAL

## 2021-10-30 MED ORDER — OXYCODONE HCL 5 MG PO TABS: 5.0000 mg | ORAL_TABLET | Freq: Once | ORAL | Status: DC | PRN

## 2021-10-30 MED ORDER — PHENYLEPHRINE HCL-NACL 20-0.9 MG/250ML-% IV SOLN
INTRAVENOUS | Status: DC | PRN
  Administered 2021-10-30: 60 ug/min via INTRAVENOUS

## 2021-10-30 MED ORDER — NALOXONE HCL 0.4 MG/ML IJ SOLN: 0.4000 mg | INTRAMUSCULAR | Status: DC | PRN

## 2021-10-30 MED ORDER — TRANEXAMIC ACID-NACL 1000-0.7 MG/100ML-% IV SOLN
INTRAVENOUS | Status: DC | PRN
  Administered 2021-10-30: 1000 mg via INTRAVENOUS

## 2021-10-30 MED ORDER — FENTANYL CITRATE (PF) 100 MCG/2ML IJ SOLN
INTRAMUSCULAR | Status: DC | PRN
  Administered 2021-10-30: 35 ug via INTRATHECAL
  Administered 2021-10-30: 15 ug via INTRATHECAL

## 2021-10-30 MED ORDER — CEFAZOLIN SODIUM-DEXTROSE 2-3 GM-%(50ML) IV SOLR
INTRAVENOUS | Status: DC | PRN
  Administered 2021-10-30: 2 g via INTRAVENOUS

## 2021-10-30 MED ORDER — SENNOSIDES-DOCUSATE SODIUM 8.6-50 MG PO TABS
2.0000 | ORAL_TABLET | Freq: Every day | ORAL | Status: DC
  Administered 2021-10-31 – 2021-11-01 (×2): 2 via ORAL
  Filled 2021-10-30 (×2): qty 2

## 2021-10-30 MED ORDER — DIBUCAINE (PERIANAL) 1 % EX OINT: 1.0000 "application " | TOPICAL_OINTMENT | CUTANEOUS | Status: DC | PRN

## 2021-10-30 MED ORDER — SIMETHICONE 80 MG PO CHEW
80.0000 mg | CHEWABLE_TABLET | Freq: Three times a day (TID) | ORAL | Status: DC
  Administered 2021-10-31 – 2021-11-01 (×5): 80 mg via ORAL
  Filled 2021-10-30 (×5): qty 1

## 2021-10-30 MED ORDER — LACTATED RINGERS IV SOLN: INTRAVENOUS | Status: DC | PRN

## 2021-10-30 MED ORDER — KETOROLAC TROMETHAMINE 30 MG/ML IJ SOLN
30.0000 mg | Freq: Four times a day (QID) | INTRAMUSCULAR | Status: AC | PRN
  Administered 2021-10-30 – 2021-10-31 (×2): 30 mg via INTRAVENOUS
  Filled 2021-10-30 (×2): qty 1

## 2021-10-30 MED ORDER — FENTANYL CITRATE (PF) 100 MCG/2ML IJ SOLN
INTRAMUSCULAR | Status: AC
  Filled 2021-10-30: qty 2

## 2021-10-30 MED ORDER — AMISULPRIDE (ANTIEMETIC) 5 MG/2ML IV SOLN
10.0000 mg | Freq: Once | INTRAVENOUS | Status: DC | PRN
  Filled 2021-10-30: qty 4

## 2021-10-30 MED ORDER — PROMETHAZINE HCL 25 MG/ML IJ SOLN: 6.2500 mg | INTRAMUSCULAR | Status: DC | PRN

## 2021-10-30 MED ORDER — PRENATAL MULTIVITAMIN CH
1.0000 | ORAL_TABLET | Freq: Every day | ORAL | Status: DC
  Administered 2021-10-31 – 2021-11-01 (×2): 1 via ORAL
  Filled 2021-10-30 (×2): qty 1

## 2021-10-30 MED ORDER — SODIUM CHLORIDE 0.9% FLUSH: 3.0000 mL | INTRAVENOUS | Status: DC | PRN

## 2021-10-30 MED ORDER — OXYCODONE HCL 5 MG/5ML PO SOLN: 5.0000 mg | Freq: Once | ORAL | Status: DC | PRN

## 2021-10-30 MED ORDER — SODIUM CHLORIDE 0.9 % IR SOLN
Status: DC | PRN
  Administered 2021-10-30: 1000 mL

## 2021-10-30 MED ORDER — OXYTOCIN-SODIUM CHLORIDE 30-0.9 UT/500ML-% IV SOLN
INTRAVENOUS | Status: AC
  Filled 2021-10-30: qty 500

## 2021-10-30 MED ORDER — OXYTOCIN-SODIUM CHLORIDE 30-0.9 UT/500ML-% IV SOLN
INTRAVENOUS | Status: DC | PRN
  Administered 2021-10-30: 400 mL via INTRAVENOUS

## 2021-10-30 SURGICAL SUPPLY — 34 items
APL SKNCLS STERI-STRIP NONHPOA (GAUZE/BANDAGES/DRESSINGS) ×1
BENZOIN TINCTURE PRP APPL 2/3 (GAUZE/BANDAGES/DRESSINGS) ×2 IMPLANT
CANISTER SUCT 3000ML PPV (MISCELLANEOUS) ×2 IMPLANT
CHLORAPREP W/TINT 26ML (MISCELLANEOUS) ×2 IMPLANT
CLAMP CORD UMBIL (MISCELLANEOUS) ×2 IMPLANT
CLOSURE STERI-STRIP 1/4X4 (GAUZE/BANDAGES/DRESSINGS) ×1 IMPLANT
CLOTH BEACON ORANGE TIMEOUT ST (SAFETY) ×2 IMPLANT
DRAPE SHEET LG 3/4 BI-LAMINATE (DRAPES) ×2 IMPLANT
DRSG OPSITE POSTOP 4X10 (GAUZE/BANDAGES/DRESSINGS) ×2 IMPLANT
ELECT REM PT RETURN 9FT ADLT (ELECTROSURGICAL) ×2
ELECTRODE REM PT RTRN 9FT ADLT (ELECTROSURGICAL) ×1 IMPLANT
GLOVE BIOGEL PI IND STRL 7.0 (GLOVE) ×3 IMPLANT
GLOVE BIOGEL PI INDICATOR 7.0 (GLOVE) ×3
GLOVE ECLIPSE 6.5 STRL STRAW (GLOVE) ×2 IMPLANT
GOWN STRL REUS W/ TWL LRG LVL3 (GOWN DISPOSABLE) ×2 IMPLANT
GOWN STRL REUS W/TWL LRG LVL3 (GOWN DISPOSABLE) ×4
NS IRRIG 1000ML POUR BTL (IV SOLUTION) ×2 IMPLANT
PAD OB MATERNITY 4.3X12.25 (PERSONAL CARE ITEMS) ×2 IMPLANT
PAD PREP 24X48 CUFFED NSTRL (MISCELLANEOUS) ×2 IMPLANT
RETRACTOR WND ALEXIS 25 LRG (MISCELLANEOUS) IMPLANT
RTRCTR WOUND ALEXIS 25CM LRG (MISCELLANEOUS)
STRIP CLOSURE SKIN 1/2X4 (GAUZE/BANDAGES/DRESSINGS) ×2 IMPLANT
SUT MON AB 3-0 SH 27 (SUTURE) ×2
SUT MON AB 3-0 SH27 (SUTURE) IMPLANT
SUT PLAIN 2 0 XLH (SUTURE) ×2 IMPLANT
SUT VIC AB 0 CT1 36 (SUTURE) ×5 IMPLANT
SUT VIC AB 2-0 CT1 27 (SUTURE) ×2
SUT VIC AB 2-0 CT1 TAPERPNT 27 (SUTURE) ×1 IMPLANT
SUT VIC AB 3-0 CT1 27 (SUTURE) ×2
SUT VIC AB 3-0 CT1 TAPERPNT 27 (SUTURE) IMPLANT
SUT VIC AB 4-0 KS 27 (SUTURE) ×2 IMPLANT
TOWEL OR 17X24 6PK STRL BLUE (TOWEL DISPOSABLE) ×6 IMPLANT
TRAY FOLEY CATH SILVER 16FR (SET/KITS/TRAYS/PACK) ×2 IMPLANT
WATER STERILE IRR 1000ML POUR (IV SOLUTION) ×2 IMPLANT

## 2021-10-30 NOTE — Transfer of Care (Signed)
Immediate Anesthesia Transfer of Care Note  Patient: Dawn [name]  Procedure(s) Performed: CESAREAN SECTION WITH BILATERAL TUBAL LIGATION  Patient Location: PACU  Anesthesia Type:Spinal  Level of Consciousness: awake, alert  and oriented  Airway & Oxygen Therapy: Patient Spontanous Breathing  Post-op Assessment: Report given to RN and Post -op Vital signs reviewed and stable  Post vital signs: Reviewed and stable  Last Vitals:  Vitals Value Taken Time  BP    Temp    Pulse 77 10/30/21 1509  Resp 18 10/30/21 1509  SpO2 97 % 10/30/21 1509  Vitals shown include unvalidated device data.  Last Pain:  Vitals:   10/30/21 1130  TempSrc:   PainSc: 6          Complications: No notable events documented.

## 2021-10-30 NOTE — MAU Provider Note (Signed)
Event Date/Time  First Provider Initiated Contact with Patient 10/30/21 1058     S: [name] is a 40 y.o. [id number] at [redacted]w[redacted]d  who presents to MAU today from [facility] with chief complaint of contractions. Patient's cervix was checked at [facility] and she was found to be 6cm dilated. She reports recurrent uncomfortable contractions since about 0500 today. She denies vaginal bleeding, she denies LOF.  Patient states she is unable to afford the copay for a tubal ligation so she is unable to have it performed. She voices plan for repeat cesarean multiple times during CNM assessment.  O: BP (!) 128/94   Pulse 79   Temp 98.9 F (37.2 C) (Oral)   Resp 17   LMP 01/31/2021   SpO2 99%  GENERAL: Well-developed, well-nourished female in no acute distress.  HEAD: Normocephalic, atraumatic.  CHEST: Normal effort of breathing, regular heart rate ABDOMEN: Soft, nontender, gravid  Cervical exam:  Dilation: 4 Exam by:: Thalia Bloodgood, CNM   Fetal Monitoring: Baseline: 130 Variability: Mod Accelerations: 15 x 15 Decelerations: none Contractions: Irregular   A: SIUP at [redacted]w[redacted]d  Now meets criteria for GHTN Term SVD x 2 (2006 & 2009), then cesarean for breech (2013), then Term VBAC x 2 (2018 & 2020) GBS Neg Vertex by BSUS Patient desires repeat cesarean Language barrier: interpreter Viria present for all patient interaction  P: Dr. Alvester Morin inbound to bedside to discuss repeat cesarean vs TOLAC  Clayton Bibles, MSA, MSN, CNM Certified Nurse Midwife, [facility] for [employer], [facility]

## 2021-10-30 NOTE — H&P (Signed)
Obstetric Preoperative History and Physical  Dawn [name] is a 40 y.o. [id number] with IUP at [redacted]w[redacted]d presenting for presenting for repeat cesarean section- Patient has has 4 vaginal deliveries including two VBACs. She declines a vaginal attempt and desires elective repeat with bilateral tubal sterilization.  No acute concerns. Patient is having q 5 minute contractions and currently 4 cm. She had a history of rapid labor.   Prenatal Course Source of Care: [facility]  with onset of care at 33wks trimester  Pregnancy complications or risks: Patient Active Problem List   Diagnosis Date Noted   Gestational hypertension 09/20/2019   Normal labor 09/19/2019   Supervision of high risk pregnancy, antepartum 07/25/2019   IUGR (intrauterine growth restriction) affecting care of mother 07/25/2019   AMA (advanced maternal age) multigravida 35+ 07/25/2019   Late prenatal care, antepartum 07/18/2019   Language barrier 07/18/2019   Hypertension in pregnancy 01/20/2017   Hereditary disease in family possibly affecting fetus, affecting management of mother, antepartum condition or complication, not applicable or unspecified fetus    History of cesarean delivery 02/04/2012   She plans to breastfeed She desires bilateral tubal ligation for postpartum contraception.   Prenatal labs and studies: ABO, Rh: --/--/O POS (12/01 1238) Antibody: NEG (12/01 1238) Rubella:  Immune RPR: NON REACTIVE (12/01 1405)  HBsAg:   NEG HIV:   NR GBS: NEG 1 hr Glucola  WNL- 130 Genetic screening normal Anatomy US normal  Prenatal Transfer Tool  Maternal Diabetes: No Genetic Screening: Normal Maternal Ultrasounds/Referrals: Normal Fetal Ultrasounds or other Referrals:  None Maternal Substance Abuse:  No Significant Maternal Medications:  None Significant Maternal Lab Results: Group B Strep negative  Past Medical History:  Diagnosis Date   Language barrier    Hospital Interpreter Eda Royal assisted with pat history    No pertinent past medical history     Past Surgical History:  Procedure Laterality Date   CESAREAN SECTION  02/01/2012   Procedure: CESAREAN SECTION;  Surgeon: Scheryl Darter, MD;  Location: [facility];  Service: Gynecology;  Laterality: N/A;  ultrasound prior c/s   DILATION AND EVACUATION N/A 01/12/2016   Procedure: DILATATION AND EVACUATION;  Surgeon: Levie Heritage, DO;  Location: [facility];  Service: Gynecology;  Laterality: N/A;  missed AB 12-14 weeks    DILATION AND EVACUATION N/A 01/15/2016   Procedure: DILATATION AND EVACUATION;  Surgeon: Adam Phenix, MD;  Location: [facility];  Service: Gynecology;  Laterality: N/A;   NO PAST SURGERIES     VAGINAL DELIVERY  x2    OB History  Gravida Para Term Preterm AB Living  SAB IAB Ectopic Multiple Live Births  1     0 5    # Outcome Date GA Lbr Len/2nd Weight Sex Delivery Anes PTL Lv  7 Current           6 Term 09/19/19 [redacted]w[redacted]d / 00:06 3070 g F VBAC None  LIV  5 Term 01/20/17 [redacted]w[redacted]d 12:45 / 00:19 3475 g F VBAC None  LIV  4 SAB 01/12/16          3 Term 02/01/12 [redacted]w[redacted]d  3355 g M CS-LTranv   LIV     Birth Comments: c/s due to breech  2 Term 09/10/08     Vag-Spont   LIV  1 Term 07/13/05     Vag-Spont   LIV    Social History   Socioeconomic History   Marital status: Single  Spouse name: Not on file   Number of children: Not on file   Years of education: Not on file   Highest education level: Not on file  Occupational History   Not on file  Tobacco Use   Smoking status: Never   Smokeless tobacco: Never  Vaping Use   Vaping Use: Never used  Substance and Sexual Activity   Alcohol use: No   Drug use: No   Sexual activity: Yes  Other Topics Concern   Not on file  Social History Narrative   Not on file   Social Determinants of Health   Financial Resource Strain: Not on file  Food Insecurity: Not on file  Transportation Needs: Not on file  Physical Activity: Not on file  Stress: Not on file  Social Connections: Not  on file    Family History  Problem Relation Age of Onset   Cancer Mother     Medications Prior to Admission  Medication Sig Dispense Refill Last Dose   Prenatal Vit-Fe Fumarate-FA (PRENATAL MULTIVITAMIN) TABS Take 1 tablet by mouth daily.   10/30/2021 at 0800   acetaminophen (TYLENOL) 325 MG tablet Take 2 tablets (650 mg total) by mouth every 4 (four) hours as needed (for pain scale < 4). 30 tablet 0     No Known Allergies  Review of Systems: Negative except for what is mentioned in HPI.  Physical Exam: BP (!) 134/93   Pulse 80   Temp 98.9 F (37.2 C) (Oral)   Resp 17   LMP 01/31/2021   SpO2 100%  FHR by Doppler: 135 bpm CONSTITUTIONAL: Well-developed, well-nourished female in no acute distress.  HENT:  Normocephalic, atraumatic, External right and left ear normal. Oropharynx is clear and moist EYES: Conjunctivae and EOM are normal. Pupils are equal, round, and reactive to light. No scleral icterus.  NECK: Normal range of motion, supple, no masses SKIN: Skin is warm and dry. No rash noted. Not diaphoretic. No erythema. No pallor. NEUROLGIC: Alert and oriented to person, place, and time. Normal reflexes, muscle tone coordination. No cranial nerve deficit noted. PSYCHIATRIC: Normal mood and affect. Normal behavior. Normal judgment and thought content. CARDIOVASCULAR: Normal heart rate noted, regular rhythm RESPIRATORY: Effort and breath sounds normal, no problems with respiration noted ABDOMEN: Soft, nontender, nondistended, gravid. Well-healed Pfannenstiel incision. PELVIC: Deferred MUSCULOSKELETAL: Normal range of motion. No edema and no tenderness. 2+ distal pulses.   Pertinent Labs/Studies:   Results for orders placed or performed during the hospital encounter of 10/29/21 (from the past 72 hour(s))  CBC     Status: None   Collection Time: 10/29/21 12:38 PM  Result Value Ref Range   WBC 9.4 4.0 - 10.5 K/uL   RBC 4.35 3.87 - 5.11 MIL/uL   Hemoglobin 12.7 12.0 - 15.0  g/dL   HCT 78.4 69.6 - 29.5 %   MCV 86.4 80.0 - 100.0 fL   MCH 29.2 26.0 - 34.0 pg   MCHC 33.8 30.0 - 36.0 g/dL   RDW 28.4 13.2 - 44.0 %   Platelets 185 150 - 400 K/uL   nRBC 0.0 0.0 - 0.2 %    Comment: Performed at [facility], [address], [place], [place] [address]  Comprehensive metabolic panel     Status: Abnormal   Collection Time: 10/29/21 12:38 PM  Result Value Ref Range   Sodium 135 135 - 145 mmol/L   Potassium 3.5 3.5 - 5.1 mmol/L   Chloride 107 98 - 111 mmol/L  CO2 20 (L) 22 - 32 mmol/L   Glucose, Bld 76 70 - 99 mg/dL    Comment: Glucose reference range applies only to samples taken after fasting for at least 8 hours.   BUN 8 6 - 20 mg/dL   Creatinine, Ser 2.77 0.44 - 1.00 mg/dL   Calcium 8.6 (L) 8.9 - 10.3 mg/dL   Total Protein 6.3 (L) 6.5 - 8.1 g/dL   Albumin 2.9 (L) 3.5 - 5.0 g/dL   AST 21 15 - 41 U/L   ALT 13 0 - 44 U/L   Alkaline Phosphatase 159 (H) 38 - 126 U/L   Total Bilirubin 0.5 0.3 - 1.2 mg/dL   GFR, Estimated >82 >42 mL/min    Comment: (NOTE) Calculated using the CKD-EPI Creatinine Equation (2021)    Anion gap 8 5 - 15    Comment: Performed at [facility], [address], [place], [place] [address]  Type and screen [facility]     Status: None   Collection Time: 10/29/21 12:38 PM  Result Value Ref Range   ABO/RH(D) O POS    Antibody Screen NEG    Sample Expiration      11/01/2021,2359 Performed at [facility], [address] [place], [place] [address]   Protein / creatinine ratio, urine     Status: Abnormal   Collection Time: 10/29/21 12:42 PM  Result Value Ref Range   Creatinine, Urine 96.28 mg/dL   Total Protein, Urine 17 mg/dL    Comment: NO NORMAL RANGE ESTABLISHED FOR THIS TEST   Protein Creatinine Ratio 0.18 (H) 0.00 - 0.15 mg/mg[Cre]    Comment: Performed at [facility], [address] [place], [place] [address]  RPR     Status: None   Collection Time: 10/29/21  2:05 PM  Result Value  Ref Range   RPR Ser Ql NON REACTIVE NON REACTIVE    Comment: Performed at [facility], [address], [place], [place] [address]  Resp Panel by RT-PCR (Flu A&B, Covid) Nasopharyngeal Swab     Status: None   Collection Time: 10/29/21  2:07 PM   Specimen: Nasopharyngeal Swab; Nasopharyngeal(NP) swabs in vial transport medium  Result Value Ref Range   SARS Coronavirus 2 by RT PCR NEGATIVE NEGATIVE    Comment: (NOTE) SARS-CoV-2 target nucleic acids are NOT DETECTED.  The SARS-CoV-2 RNA is generally detectable in upper respiratory specimens during the acute phase of infection. The lowest concentration of SARS-CoV-2 viral copies this assay can detect is 138 copies/mL. A negative result does not preclude SARS-Cov-2 infection and should not be used as the sole basis for treatment or other patient management decisions. A negative result may occur with  improper specimen collection/handling, submission of specimen other than nasopharyngeal swab, presence of viral mutation(s) within the areas targeted by this assay, and inadequate number of viral copies(<138 copies/mL). A negative result must be combined with clinical observations, patient history, and epidemiological information. The expected result is Negative.  Fact Sheet for Patients:  BloggerCourse.com  Fact Sheet for Healthcare Providers:  SeriousBroker.it  This test is no t yet approved or cleared by the [place] FDA and  has been authorized for detection and/or diagnosis of SARS-CoV-2 by FDA under an Emergency Use Authorization (EUA). This EUA will remain  in effect (meaning this test can be used) for the duration of the COVID-19 declaration under Section 564(b)(1) of the Act, 21 U.S.C.section 360bbb-3(b)(1), unless the authorization is terminated  or revoked sooner.       Influenza A by PCR NEGATIVE NEGATIVE   Influenza B by PCR NEGATIVE NEGATIVE    Comment:  (NOTE) The Xpert Xpress SARS-CoV-2/FLU/RSV plus assay is intended as an aid in the diagnosis of influenza from Nasopharyngeal swab specimens and should not be used as a sole basis for treatment. Nasal washings and aspirates are unacceptable for Xpert Xpress SARS-CoV-2/FLU/RSV testing.  Fact Sheet for Patients: BloggerCourse.com  Fact Sheet for Healthcare Providers: SeriousBroker.it  This test is not yet approved or cleared by the [place] FDA and has been authorized for detection and/or diagnosis of SARS-CoV-2 by FDA under an Emergency Use Authorization (EUA). This EUA will remain in effect (meaning this test can be used) for the duration of the COVID-19 declaration under Section 564(b)(1) of the Act, 21 U.S.C. section 360bbb-3(b)(1), unless the authorization is terminated or revoked.  Performed at [facility], [address], [place], [place] [address]     Assessment and Plan :Dawn [name] is a 40 y.o. [id number] at [redacted]w[redacted]d being admitted being admitted for scheduled cesarean section. The risks of cesarean section discussed with the patient included but were not limited to: bleeding which may require transfusion or reoperation; infection which may require antibiotics; injury to bowel, bladder, ureters or other surrounding organs; injury to the fetus; need for additional procedures including hysterectomy in the event of a life-threatening hemorrhage; placental abnormalities wth subsequent pregnancies, incisional problems, thromboembolic phenomenon and other postoperative/anesthesia complications.   Patient has undesired fertility and expressed desire for permanent sterilization.  I reviewed with the patient her expressed plan for tubal sterilization.  We dicussed other reversible forms of contraception including LARC options with similar effectiveness of BTS. She expressed that she strongly desire permanent sterilization.  She declines all other modalities. Risks of procedure discussed with patient including but not limited to: risk of regret, permanence of method, bleeding, infection, injury to surrounding organs and need for additional procedures.  Failure risk of 0.5-1% with increased risk of ectopic gestation if pregnancy occurs was also discussed with patient.    The patient concurred with the proposed plan, giving informed written consent for the procedure. Patient has been NPO since last night she will remain NPO for procedure. Anesthesia and OR aware. Preoperative prophylactic antibiotics and SCDs ordered on call to the OR. To OR when ready.    Federico Flake, MD, MPH, ABFM Attending Physician Center for [facility]

## 2021-10-30 NOTE — Progress Notes (Signed)
Error

## 2021-10-30 NOTE — Discharge Summary (Signed)
Postpartum Discharge Summary     Patient Name: Dawn [name] DOB: [birth year] MRN: [id number]  Date of admission: 10/30/2021 Delivery date:10/30/2021  Delivering provider: Caren Macadam  Date of discharge: 11/01/2021  Admitting diagnosis: Supervision of other normal pregnancy, antepartum [Z34.80] Intrauterine pregnancy: [redacted]w[redacted]d     Secondary diagnosis:  Principal Problem:   Supervision of other normal pregnancy, antepartum Active Problems:   History of cesarean delivery   AMA (advanced maternal age) multigravida 35+   S/P repeat low transverse C-section   Status post bilateral salpingectomy  Additional problems: None    Discharge diagnosis: Term Pregnancy Delivered                                              Post partum procedures: None Augmentation: N/A Complications: None  Hospital course: Onset of Labor With Unplanned C/S   40 y.o. yo [id number] at [redacted]w[redacted]d was admitted in [place] on 10/30/2021. Patient opted for repeat cesarean section and BTL after thorough discussion with Dr. Ernestina Patches The patient went for cesarean section due to Elective Repeat. Delivery details as follows: Membrane Rupture Time/Date: 1:53 PM ,10/30/2021   Delivery Method:C-Section, Low Transverse  Details of operation can be found in separate operative note. Patient had an uncomplicated postpartum course.  She is ambulating, tolerating a regular diet, passing flatus, and urinating well. She was started on oral iron for mild post-operative anemia and remained asymptomatic during her hospital stay. Patient is discharged home in stable condition 11/01/21.  Newborn Data: Birth date:10/30/2021  Birth time:1:53 PM  Gender:Female  Living status:Living  Apgars:9 ,10  Weight:3090 g   Magnesium Sulfate received: No BMZ received: No Rhophylac: N/A MMR: N/A T-DaP: Offered postpartum  Flu: No Transfusion: No  Physical exam  Vitals:   10/31/21 0840 10/31/21 1445 10/31/21 2115 11/01/21 0516  BP:  106/68 100/69 106/73 99/69  Pulse: 69 79 76 72  Resp: $Remo'18 16 15   'GVZmQ$ Temp: (!) 97.5 F (36.4 C) 98 F (36.7 C) 98 F (36.7 C) 97.7 F (36.5 C)  TempSrc: Oral Oral Oral Oral  SpO2: 100% 100% 100% 100%  Weight:       General: alert, cooperative, and no distress Lochia: appropriate Uterine Fundus: firm and below umbilicus  Incision: healing well with no significant drainage, no significant erythema, dressing is clean, dry, and intact DVT Evaluation: no LE edema or calf tenderness to palpation  Labs: Lab Results  Component Value Date   WBC 18.4 (H) 10/31/2021   HGB 9.4 (L) 10/31/2021   HCT 28.0 (L) 10/31/2021   MCV 87.5 10/31/2021   PLT 156 10/31/2021   CMP Latest Ref Rng & Units 10/29/2021  Glucose 70 - 99 mg/dL 76  BUN 6 - 20 mg/dL 8  Creatinine 0.44 - 1.00 mg/dL 0.50  Sodium 135 - 145 mmol/L 135  Potassium 3.5 - 5.1 mmol/L 3.5  Chloride 98 - 111 mmol/L 107  CO2 22 - 32 mmol/L 20(L)  Calcium 8.9 - 10.3 mg/dL 8.6(L)  Total Protein 6.5 - 8.1 g/dL 6.3(L)  Total Bilirubin 0.3 - 1.2 mg/dL 0.5  Alkaline Phos 38 - 126 U/L 159(H)  AST 15 - 41 U/L 21  ALT 0 - 44 U/L 13   Edinburgh Score: Edinburgh Postnatal Depression Scale Screening Tool 10/30/2021  I have been able to laugh and see the funny side of things. 0  I have looked forward with enjoyment to things. 0  I have blamed myself unnecessarily when things went wrong. 0  I have been anxious or worried for no good reason. 0  I have felt scared or panicky for no good reason. 0  Things have been getting on top of me. 0  I have been so unhappy that I have had difficulty sleeping. 0  I have felt sad or miserable. 0  I have been so unhappy that I have been crying. 0  The thought of harming myself has occurred to me. 0  Edinburgh Postnatal Depression Scale Total 0     After visit meds:  Allergies as of 11/01/2021   No Known Allergies      Medication List     TAKE these medications    acetaminophen 500 MG tablet Commonly  known as: TYLENOL Take 2 tablets (1,000 mg total) by mouth every 8 (eight) hours as needed (pain). What changed:  medication strength how much to take when to take this reasons to take this   ferrous sulfate 325 (65 FE) MG tablet Take 1 tablet (325 mg total) by mouth every other day. Start taking on: November 02, 2021   ibuprofen 600 MG tablet Commonly known as: ADVIL Take 1 tablet (600 mg total) by mouth every 6 (six) hours as needed (pain).   oxyCODONE 5 MG immediate release tablet Commonly known as: Oxy IR/ROXICODONE Take 1 tablet (5 mg total) by mouth every 6 (six) hours as needed for severe pain or breakthrough pain.   prenatal multivitamin Tabs tablet Take 1 tablet by mouth daily.         Discharge home in stable condition Infant Feeding: Bottle and Breast Infant Disposition: home with mother Discharge instruction: per After Visit Summary and Postpartum booklet. Activity: Advance as tolerated. Pelvic rest for 6 weeks.  Diet: routine diet Future Appointments:No future appointments. Follow up Visit: Message sent to [facility] by Dr. Cy Blamer on 12/2 for 1 week incision check Patient to call and schedule her 6 wk PP appt at [facility]  Please schedule this patient for a In person postpartum visit in 4 weeks with the following provider: Any provider. Additional Postpartum F/U: Incision check 1 week  Low risk pregnancy complicated by:  None Delivery mode:  C-Section, Low Transverse  Anticipated Birth Control:  BTL done [facility]  11/01/2021 Genia Del, MD

## 2021-10-30 NOTE — Op Note (Signed)
Operative Note   Patient: Dawn [name]  Date of Procedure: 10/30/2021  Procedure: Repeat Low Transverse Cesarean and Bilateral Tubal Ligation via Bilateral salpingectomy, in labor with contractions and dilation of 4cm  Indications: patient declines vag del attempt, elective repeat, previous uterine incision: low transverse and undesired fertility  Pre-operative Diagnosis: repeat c/s.   Post-operative Diagnosis: Same  TOLAC Candidate: No  Surgeon: Surgeon(s) and Role:    * Federico Flake, MD - Primary    * Warner Mccreedy, MD - Fellow  Assistants: none  An experienced assistant was required given the standard of surgical care given the complexity of the case.  This assistant was needed for exposure, dissection, suctioning, retraction, instrument exchange, assisting with delivery with administration of fundal pressure, and for overall help during the procedure.   Anesthesia: spinal  Anesthesiologist: Mellody Dance, MD   Antibiotics: Cefazolin   Estimated Blood Loss: 843 ml   Total IV Fluids: 2000 ml  Urine Output:  150 cc OF clear urine  Specimens: Placenta to LD   Complications: no complications   Indications: Dawn [name] is a 40 y.o. [id number] with an IUP [redacted]w[redacted]d presenting for unscheduled, urgent cesarean secondary to the indications listed above. Clinical course notable for late prenatal care at [facility].  Findings: Viable infant in cephalic presentation, nuchal x1- reduced.  Apgars 9 , 10 , . Weight 3090 g . Clear amniotic fluid. Normal placenta, three vessel cord. Normal uterus, Normal bilateral and Cyst on left fallopian tubes, Normal bilateral ovaries.  Procedure Details:  A Time Out was held and the above information confirmed. The patient received intravenous antibiotics and had sequential compression devices applied to her lower extremities preoperatively. The patient was taken back to the operative suite where spinal anesthesia was administered.  After induction of anesthesia, the patient was draped and prepped in the usual sterile manner and placed in a dorsal supine position with a leftward tilt. A low transverse skin incision was made with scalpel and carried down through the subcutaneous tissue to the fascia. Fascial incision was made and extended transversely. The fascia was separated from the underlying rectus tissue superiorly and inferiorly. The rectus muscles were separated in the midline bluntly and the peritoneum was entered bluntly. An Alexis retractor was placed to aid in visualization of the uterus. A bladder flap was not developed. A low transverse uterine incision was made. The infant was successfully delivered from cephalic presentation, the umbilical cord was clamped after 1 minute. Cord ph was not sent, and cord blood was obtained for evaluation. The placenta was removed Intact and appeared normal. The uterine incision was closed with running locked sutures of 0-Vicryl, and then a second imbricating layer was also placed with 0-vicryl. Several figure of eight stitches were placed on the left side of the hysterotomy with 0 Vicryl. Overall, excellent hemostasis was noted.   Attention was then turned to the fallopian tubes. Bilateral salpingectomy: A Kelly clamp was placed across the left fallopian tube taking care to incorporate the fimbriae. A second clamp was then placed below the first. The fallopian tube was then removed with Metzenbaum scissors. The pedicle was then ligated with 2-0 Monocryl suture and the second clamp was removed with excellent hemostasis noted. Then a second ligature of 0 Monocryl suture was placed below the remaining clamp, the clamp was then removed and again excellent hemostasis was observed. The same procedure was then carried out on the right fallopian tube with excellent hemostasis noted.  The abdomen and  the pelvis were cleared of all clot and debris and the Jon Gills was removed. Hemostasis was confirmed on  all surfaces.  The peritoneum was reapproximated using 2-0 vicryl . The fascia was then closed using 0 Vicryl in a running fashion. The subcutaneous layer was reapproximated with plain gut and the skin was closed with a 4-0 vicryl subcuticular stitch. The patient tolerated the procedure well. Sponge, lap, instrument and needle counts were correct x 2. She was taken to the recovery room in stable condition.  Disposition: PACU - hemodynamically stable.    Signed: Federico Flake, MD, MPH [facility])

## 2021-10-30 NOTE — Lactation Note (Signed)
This note was copied from a baby's chart. Lactation Consultation Note  Patient Name: Dawn [name] KPTWS'F Date: 10/30/2021 Reason for consult: Initial assessment;Early term 37-38.6wks Age:38 hours In-house Spanish Interpreter [place] used. Per mom, she breastfeed all 5 of her other children for one year each and child number 5 is currently 50 years old.  Mom changed one void and one  stool while LC in room. Per mom, breastfeeding is going well infant BF for 10 minutes on right breast in cradle hold position, mom switched to left while LC in room, infant breastfeed with depth for additional 5 minutes.  Total feeding was 15 minutes in length. Mom requested hand pump for prn when demonstrating how to use less than 3 minutes mom expressed 6 mls of colostrum that was bottle feed to infant with slow flow bottle nipple. Per mom, she is not having any pain when infant latches, she is only feeling a tug with latch. Infant appeared content after the feeding. Mom knows to breastfeed infant according to feeding cues, 8 to 12+ or more times within 24 hours, skin to skin. Mom knows to call RN/LC for latch assistance if needed. Mom shown how to use hand pump  & how to disassemble, clean, & reassemble parts and LC discussed breast milk storage and collection with mom.  Mom made aware of O/P services, breastfeeding support groups, community resources, and our phone # for post-discharge questions.   Maternal Data Has patient been taught Hand Expression?: Yes Does the patient have breastfeeding experience prior to this delivery?: Yes How long did the patient breastfeed?: Per mom, she breastfeed all 5 children for one year  Feeding Mother's Current Feeding Choice: Breast Milk Nipple Type: Slow - flow  LATCH Score Latch: Grasps breast easily, tongue down, lips flanged, rhythmical sucking.  Audible Swallowing: Spontaneous and intermittent  Type of Nipple: Everted at rest and after  stimulation  Comfort (Breast/Nipple): Soft / non-tender  Hold (Positioning): No assistance needed to correctly position infant at breast.  LATCH Score: 10   Lactation Tools Discussed/Used Tools: Pump;Flanges Flange Size: 27 Breast pump type: Manual Pump Education: Setup, frequency, and cleaning;Milk Storage Reason for Pumping: Mom requested hand pump Pumped volume: 5 mL (Mom only pumped 2 minutes got 6 mls of colostrum)  Interventions Interventions: Breast feeding basics reviewed;Assisted with latch;Skin to skin;Hand express;Expressed milk;Hand pump;Education;Pace feeding;LC Services brochure  Discharge Pump: Manual WIC Program: Yes  Consult Status Consult Status: Follow-up Date: 10/31/21 Follow-up type: In-patient    Danelle Earthly 10/30/2021, 10:20 PM

## 2021-10-30 NOTE — Op Note (Incomplete)
Merwyn Katos PROCEDURE DATE: 10/30/2021  PREOPERATIVE DIAGNOSES: Intrauterine pregnancy at [redacted]w[redacted]d weeks gestation; {c-section indications:32119};   POSTOPERATIVE DIAGNOSES: The same  PROCEDURE: Primary***Repeat Low Transverse Cesarean Section,   SURGEON:  ***  ASSISTANT:  ***  ANESTHESIOLOGIST: Dr. Marland Kitchen  INDICATIONS: Dawn [name] is a 40 y.o. [id number] at [redacted]w[redacted]d here for cesarean section and bilateral tubal sterilization secondary to the indications listed under preoperative diagnoses; please see preoperative note for further details.  The risks of surgery were discussed with the patient including but were not limited to: bleeding which may require transfusion or reoperation; infection which may require antibiotics; injury to bowel, bladder, ureters or other surrounding organs; injury to the fetus; need for additional procedures including hysterectomy in the event of a life-threatening hemorrhage; placental abnormalities wth subsequent pregnancies, incisional problems, thromboembolic phenomenon and other postoperative/anesthesia complications.  Patient also desires permanent sterilization.  Other reversible forms of contraception were discussed with patient; she declines all other modalities. Risks of procedure discussed with patient including but not limited to: risk of regret, permanence of method, bleeding, infection, injury to surrounding organs and need for additional procedures.  Failure risk of 1-2% with increased risk of ectopic gestation if pregnancy occurs was also discussed with patient.  The patient concurred with the proposed plan, giving informed written consent for the procedures.    FINDINGS:  Viable {Desc; female/female:11659} infant in cephalic presentation.  Apgars *** and ***.  Clear amniotic fluid.  Intact placenta, three vessel cord.  Normal uterus, fallopian tubes and ovaries bilaterally. Fallopian tubes sterilized with Filshie clips bilaterally.  ANESTHESIA:  Spinal***Epidural INTRAVENOUS FLUIDS: *** ml ESTIMATED BLOOD LOSS: *** ml URINE OUTPUT:  *** ml SPECIMENS: Placenta sent to pathology***L&D COMPLICATIONS: None immediate  PROCEDURE IN DETAIL:  The patient preoperatively received intravenous antibiotics and had sequential compression devices applied to her lower extremities.   She was then taken to the operating room where spinal anesthesia was administered***the epidural anesthesia was dosed up to surgical level*** and was found to be adequate. She was then placed in a dorsal supine position with a leftward tilt, and prepped and draped in a sterile manner.  A foley catheter was placed into her bladder and attached to constant gravity.  After an adequate timeout was performed, a Pfannenstiel skin incision was made with scalpel and carried through to the underlying layer of fascia. The fascia was incised in the midline, and this incision was extended bilaterally using the Mayo scissors.  Kocher clamps were applied to the superior aspect of the fascial incision and the underlying rectus muscles were dissected off bluntly. A similar process was carried out on the inferior aspect of the fascial incision. The rectus muscles were separated in the midline bluntly and the peritoneum was entered bluntly. Attention was turned to the lower uterine segment where a low transverse hysterotomy was made with a scalpel and extended bilaterally bluntly.  The infant was successfully delivered, the cord was clamped and cut and the infant was handed over to awaiting neonatology team. Uterine massage was then administered, and the placenta delivered intact with a three-vessel cord. The uterus was then cleared of clot and debris.  The hysterotomy was closed with 0 Vicryl in a running locked fashion, and an imbricating layer was also placed with 0 Vicryl. Figure-of-eight serosal stitches were placed to help with hemostasis.     The pelvis was cleared of all clot and debris.  Hemostasis was confirmed on all surfaces.  The peritoneum and the muscles were reapproximated  using 0 Vicryl interrupted stitches. The fascia was then closed using 0 Vicryl***PDS in a running fashion.  The subcutaneous layer was irrigated, ***then reapproximated with 2-0 plain gut interrupted stitches, and 30 ml of 0.5% Marcaine was injected subcutaneously around the incision.  The skin was closed with a 4-0 Vicryl subcuticular stitch. The patient tolerated the procedure well. Sponge, lap, instrument and needle counts were correct x 2.  She was taken to the recovery room in stable condition.   Warner Mccreedy, MD, MPH OB Fellow Faculty [facility]

## 2021-10-30 NOTE — Anesthesia Procedure Notes (Signed)
Spinal  Patient location during procedure: OR Start time: 10/30/2021 1:20 PM End time: 10/30/2021 1:25 PM Reason for block: surgical anesthesia Staffing Performed: anesthesiologist  Anesthesiologist: Mellody Dance, MD Preanesthetic Checklist Completed: patient identified, IV checked, site marked, risks and benefits discussed, surgical consent, monitors and equipment checked, pre-op evaluation and timeout performed Spinal Block Patient position: sitting Prep: DuraPrep Patient monitoring: heart rate, cardiac monitor, continuous pulse ox and blood pressure Approach: midline Location: L3-4 Injection technique: single-shot Needle Needle type: Sprotte  Needle gauge: 24 G Needle length: 9 cm Assessment Sensory level: T4 Events: CSF return

## 2021-10-30 NOTE — Anesthesia Preprocedure Evaluation (Signed)
Anesthesia Evaluation  Patient identified by MRN, date of birth, ID band Patient awake    Reviewed: Allergy & Precautions, H&P , NPO status , Patient's Chart, lab work & pertinent test results  Airway Mallampati: II  TM Distance: >3 FB Neck ROM: Full    Dental no notable dental hx.    Pulmonary neg pulmonary ROS,    Pulmonary exam normal breath sounds clear to auscultation       Cardiovascular hypertension, Normal cardiovascular exam Rhythm:Regular Rate:Normal     Neuro/Psych negative neurological ROS  negative psych ROS   GI/Hepatic negative GI ROS, Neg liver ROS,   Endo/Other  negative endocrine ROS  Renal/GU negative Renal ROS  negative genitourinary   Musculoskeletal negative musculoskeletal ROS (+)   Abdominal   Peds negative pediatric ROS (+)  Hematology negative hematology ROS (+)   Anesthesia Other Findings   Reproductive/Obstetrics (+) Pregnancy                             Anesthesia Physical Anesthesia Plan  ASA: 2  Anesthesia Plan: Spinal   Post-op Pain Management:    Induction: Intravenous  PONV Risk Score and Plan: 2 and Treatment may vary due to age or medical condition  Airway Management Planned: Natural Airway  Additional Equipment:   Intra-op Plan:   Post-operative Plan:   Informed Consent: I have reviewed the patients History and Physical, chart, labs and discussed the procedure including the risks, benefits and alternatives for the proposed anesthesia with the patient or authorized representative who has indicated his/her understanding and acceptance.     Interpreter used for [employer] Discussed with: Anesthesiologist  Anesthesia Plan Comments:         Anesthesia Quick Evaluation

## 2021-10-30 NOTE — Anesthesia Postprocedure Evaluation (Signed)
Anesthesia Post Note  Patient: Dawn [name]  Procedure(s) Performed: CESAREAN SECTION WITH BILATERAL TUBAL LIGATION     Patient location during evaluation: PACU Anesthesia Type: Spinal Level of consciousness: oriented and awake and alert Pain management: pain level controlled Vital Signs Assessment: post-procedure vital signs reviewed and stable Respiratory status: spontaneous breathing and respiratory function stable Cardiovascular status: blood pressure returned to baseline and stable Postop Assessment: no headache, no backache, no apparent nausea or vomiting and able to ambulate Anesthetic complications: no   No notable events documented.  Last Vitals:  Vitals:   10/30/21 1509 10/30/21 1530  BP: 108/73 106/69  Pulse: 77 68  Resp: 18 14  Temp: 36.6 C 36.7 C  SpO2: 97% 100%    Last Pain:  Vitals:   10/30/21 1530  TempSrc:   PainSc: 0-No pain   Pain Goal:                Epidural/Spinal Function Cutaneous sensation: Able to Wiggle Toes (10/30/21 1530), Patient able to flex knees: Yes (10/30/21 1530), Patient able to lift hips off bed: Yes (10/30/21 1530), Back pain beyond tenderness at insertion site: No (10/30/21 1530), Progressively worsening motor and/or sensory loss: No (10/30/21 1530), Bowel and/or bladder incontinence post epidural: No (10/30/21 1530)  Mellody Dance

## 2021-10-31 ENCOUNTER — Encounter (HOSPITAL_COMMUNITY): Admission: RE | Payer: Self-pay | Source: Home / Self Care

## 2021-10-31 ENCOUNTER — Inpatient Hospital Stay (HOSPITAL_COMMUNITY): Admission: RE | Admit: 2021-10-31 | Payer: Self-pay | Source: Home / Self Care | Admitting: Obstetrics & Gynecology

## 2021-10-31 ENCOUNTER — Encounter (HOSPITAL_COMMUNITY): Payer: Self-pay | Admitting: Family Medicine

## 2021-10-31 DIAGNOSIS — Z98891 History of uterine scar from previous surgery: Secondary | ICD-10-CM

## 2021-10-31 LAB — CBC
HCT: 28 % — ABNORMAL LOW (ref 36.0–46.0)
Hemoglobin: 9.4 g/dL — ABNORMAL LOW (ref 12.0–15.0)
MCH: 29.4 pg (ref 26.0–34.0)
MCHC: 33.6 g/dL (ref 30.0–36.0)
MCV: 87.5 fL (ref 80.0–100.0)
Platelets: 156 10*3/uL (ref 150–400)
RBC: 3.2 MIL/uL — ABNORMAL LOW (ref 3.87–5.11)
RDW: 13.8 % (ref 11.5–15.5)
WBC: 18.4 10*3/uL — ABNORMAL HIGH (ref 4.0–10.5)
nRBC: 0 % (ref 0.0–0.2)

## 2021-10-31 SURGERY — Surgical Case
Anesthesia: Regional | Laterality: Bilateral

## 2021-10-31 MED ORDER — FERROUS SULFATE 325 (65 FE) MG PO TABS
325.0000 mg | ORAL_TABLET | ORAL | Status: DC
  Administered 2021-10-31: 325 mg via ORAL
  Filled 2021-10-31: qty 1

## 2021-10-31 NOTE — Lactation Note (Signed)
This note was copied from a baby's chart. Lactation Consultation Note  Patient Name: Dawn [name] FVCBS'W Date: 10/31/2021   Age:38 hours Spanish in house interpreter  #[place] used.   P6, ETI female infant -3% weight loss. Infant is currently breastfeeding and being supplementing with formula, this is mom's choice. Per mom, she feels breastfeeding is going well and she doesn't have any questions or breastfeeding questions for LC at this time. LC did not observed current latch, per mom, infant breastfeed on both breast for 20 minutes, afterwards infant was given 9 mls of formula. Mom was given breastfeeding supplemental sheet in spanish by interpreter, mom plan is to latch infant first with every feeding and afterwards offer formula until her milk supply increases, this is mom's choice.  Infant is currently asleep in basinet.  Maternal Data    Feeding Nipple Type: Slow - flow  LATCH Score                    Lactation Tools Discussed/Used    Interventions    Discharge    Consult Status      Danelle Earthly 10/31/2021, 10:57 PM

## 2021-10-31 NOTE — Progress Notes (Signed)
Post Operative Day 1 Subjective: Doing well. No acute events overnight. Pain is controlled and bleeding is appropriate. She is eating, drinking, voiding, and ambulating without issue. She is breast feeding which is going well. She has no other concerns at this time.  Objective: Blood pressure 106/68, pulse 69, temperature (!) 97.5 F (36.4 C), temperature source Oral, resp. rate 18, weight 61.7 kg, last menstrual period 01/31/2021, SpO2 100 %, unknown if currently breastfeeding.  Physical Exam:  General: alert, cooperative, and no distress Lochia: appropriate Uterine Fundus: firm and below umbilicus  Incision: healing well, no significant drainage, no significant erythema, dressing is clean/dry/intact  DVT Evaluation: no LE edema or calf tenderness to palpation   Recent Labs    10/29/21 1238 10/31/21 0549  HGB 12.7 9.4*  HCT 37.6 28.0*    Assessment/Plan: Dawn [name] is a 40 y.o. [id number] on POD# 1 s/p rLTCS.  Progressing well. Meeting postpartum milestones. VSS. Continue routine postpartum care.  Acute blood loss anemia: - Asymptomatic  - Will start PO iron   Feeding: Breast and formula  Contraception: BTL done   Dispo: Plan for discharge on PPD#2.   LOS: 1 day   Worthy Rancher, MD  10/31/2021, 8:59 AM

## 2021-11-01 MED ORDER — FERROUS SULFATE 325 (65 FE) MG PO TABS: 325.0000 mg | ORAL_TABLET | ORAL | 1 refills | Status: AC

## 2021-11-01 MED ORDER — ACETAMINOPHEN 500 MG PO TABS: 1000.0000 mg | ORAL_TABLET | Freq: Three times a day (TID) | ORAL | 0 refills | Status: AC | PRN

## 2021-11-01 MED ORDER — IBUPROFEN 600 MG PO TABS: 600.0000 mg | ORAL_TABLET | Freq: Four times a day (QID) | ORAL | 0 refills | Status: AC | PRN

## 2021-11-01 MED ORDER — OXYCODONE HCL 5 MG PO TABS: 5.0000 mg | ORAL_TABLET | Freq: Four times a day (QID) | ORAL | 0 refills | Status: AC | PRN

## 2021-11-01 NOTE — Lactation Note (Signed)
This note was copied from a baby's chart. Lactation Consultation Note  Patient Name: Dawn [name] ZLDJT'T Date: 11/01/2021 Reason for consult: Follow-up assessment Age:38 hours  P6, Video interpreter Dawn [name] used for [place]. Mother is breastfeeding and formula feeding. Recommend offering the breast before formula to help establish her milk supply. Mother denies questions or concerns. Reviewed engorgement care and monitoring voids/stools.   Feeding Mother's Current Feeding Choice: Breast Milk and Formula   Lactation Tools Discussed/Used Tools: Pump Flange Size: 27 Breast pump type: Manual  Interventions Interventions: Education  Discharge Discharge Education: Engorgement and breast care;Warning signs for feeding baby  Consult Status Consult Status: Complete Date: 11/01/21    Dahlia Byes [facility] 11/01/2021, 11:06 AM

## 2021-11-03 LAB — SURGICAL PATHOLOGY

## 2021-11-06 ENCOUNTER — Telehealth: Payer: Self-pay | Admitting: *Deleted

## 2021-11-06 ENCOUNTER — Ambulatory Visit: Payer: Self-pay

## 2021-11-06 NOTE — Telephone Encounter (Signed)
[name] [facility] appointment for Incision check. I called with Interpreter Raquel Carrolyn Meiers and unable to leave a message on her mobile phone or home phone. Sesilia Poucher,RN

## 2021-11-12 ENCOUNTER — Telehealth (HOSPITAL_COMMUNITY): Payer: Self-pay | Admitting: *Deleted

## 2021-11-12 NOTE — Telephone Encounter (Signed)
Hospital Discharge Follow-Up Call:  Spoke to patient with help of telephonic interpreter "[place]".  She reports that she is doing well and has no concerns about her healing process.  EPDS today is 0 and she endorses this accurately reflects that she is doing well emotionally.  Patient says that baby is well and she has no concerns about baby's health.  She reports that baby sleeps in a crib.  ABCs of Safe Sleep reviewed.
